# Patient Record
Sex: Female | Born: 1960 | Race: White | Hispanic: No | Smoking: Former smoker
Health system: Southern US, Community
[De-identification: ages and names within clinical notes are randomized; demographics above are authoritative.]

## PROBLEM LIST (undated history)

## (undated) DIAGNOSIS — I8390 Asymptomatic varicose veins of unspecified lower extremity: Secondary | ICD-10-CM

## (undated) DIAGNOSIS — Z8669 Personal history of other diseases of the nervous system and sense organs: Secondary | ICD-10-CM

## (undated) DIAGNOSIS — N200 Calculus of kidney: Secondary | ICD-10-CM

## (undated) DIAGNOSIS — E119 Type 2 diabetes mellitus without complications: Secondary | ICD-10-CM

## (undated) HISTORY — DX: Type 2 diabetes mellitus without complications: E11.9

## (undated) HISTORY — DX: Personal history of other diseases of the nervous system and sense organs: Z86.69

## (undated) HISTORY — DX: Asymptomatic varicose veins of unspecified lower extremity: I83.90

## (undated) HISTORY — DX: Calculus of kidney: N20.0

---
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
## 2004-03-18 LAB — HM MAMMOGRAPHY

## 2005-02-22 LAB — HM PAP SMEAR: HM PAP: NEGATIVE

## 2005-10-19 ENCOUNTER — Emergency Department: Payer: Self-pay | Admitting: Emergency Medicine

## 2005-10-19 ENCOUNTER — Other Ambulatory Visit: Payer: Self-pay

## 2005-11-30 DIAGNOSIS — G43909 Migraine, unspecified, not intractable, without status migrainosus: Secondary | ICD-10-CM | POA: Insufficient documentation

## 2005-12-08 ENCOUNTER — Ambulatory Visit: Payer: Self-pay | Admitting: Family Medicine

## 2005-12-25 ENCOUNTER — Ambulatory Visit: Payer: Self-pay | Admitting: Family Medicine

## 2006-05-17 ENCOUNTER — Ambulatory Visit: Payer: Self-pay | Admitting: Obstetrics and Gynecology

## 2006-05-31 ENCOUNTER — Ambulatory Visit: Payer: Self-pay | Admitting: Obstetrics and Gynecology

## 2006-09-02 ENCOUNTER — Ambulatory Visit: Payer: Self-pay | Admitting: Family Medicine

## 2009-06-03 ENCOUNTER — Ambulatory Visit: Payer: Self-pay

## 2012-11-14 ENCOUNTER — Ambulatory Visit: Payer: Self-pay | Admitting: Family Medicine

## 2012-11-20 DIAGNOSIS — E119 Type 2 diabetes mellitus without complications: Secondary | ICD-10-CM | POA: Insufficient documentation

## 2013-07-27 LAB — CBC AND DIFFERENTIAL
HCT: 39 % (ref 36–46)
HEMOGLOBIN: 13.1 g/dL (ref 12.0–16.0)
PLATELETS: 417 10*3/uL — AB (ref 150–399)
WBC: 12.7 10*3/mL

## 2013-08-01 ENCOUNTER — Ambulatory Visit: Payer: Self-pay | Admitting: Family Medicine

## 2013-09-25 ENCOUNTER — Ambulatory Visit: Payer: Self-pay | Admitting: Family Medicine

## 2013-10-03 ENCOUNTER — Ambulatory Visit: Payer: Self-pay | Admitting: Family Medicine

## 2013-11-20 ENCOUNTER — Ambulatory Visit: Payer: Self-pay | Admitting: Family Medicine

## 2013-11-21 ENCOUNTER — Ambulatory Visit: Payer: Self-pay | Admitting: Family Medicine

## 2014-02-22 HISTORY — PX: OTHER SURGICAL HISTORY: SHX169

## 2014-04-17 ENCOUNTER — Ambulatory Visit: Payer: Self-pay | Admitting: Family Medicine

## 2014-07-08 ENCOUNTER — Ambulatory Visit: Payer: Self-pay | Admitting: Anesthesiology

## 2014-07-24 ENCOUNTER — Ambulatory Visit: Payer: Self-pay | Admitting: Anesthesiology

## 2014-08-06 ENCOUNTER — Ambulatory Visit: Payer: Self-pay | Admitting: Family Medicine

## 2014-08-07 ENCOUNTER — Ambulatory Visit: Payer: Self-pay | Admitting: Family Medicine

## 2014-08-27 ENCOUNTER — Ambulatory Visit: Payer: Self-pay | Admitting: Anesthesiology

## 2014-10-22 ENCOUNTER — Ambulatory Visit: Payer: Self-pay | Admitting: Anesthesiology

## 2014-12-09 ENCOUNTER — Ambulatory Visit
Admit: 2014-12-09 | Disposition: A | Discharge status: Home / Self Care | Payer: Self-pay | Attending: Family Medicine | Admitting: Family Medicine

## 2014-12-14 NOTE — H&P (Signed)
PATIENT NAME:  Jean Cox, Jean Cox MR#:  [id number] DATE OF BIRTH:  03-Sep-1960  DATE OF ADMISSION:  07/08/2014  CHIEF COMPLAINT: Low back pain with posterolateral leg pain and right posterolateral shoulder pain with right arm pain.   PROCEDURE: None.   HISTORY OF PRESENT ILLNESS: Miss Tanton is a pleasant 54 year old white female with long-standing history of low back pain which is her primary pain complaint today. It has been present for 15 months and she has had an MRI that showed evidence of some degenerative disk disease. This is unavailable to me at this time, but has been requested. The pain has gradually gotten worse to the point where she is documenting a VAS of 11/10, best a 2, now a 4, and pain that is worse in the morning and after activity. Aggravating factors include bending, kneeling, lifting, squatting, and turning. Alleviating factors include stretching, hot pack placement, medication management, and physical therapy. Physical therapy has given her some mild relief, perhaps she says. She is doing stretching exercises that seem to help as well. No problems with bowel or bladder function and note she has documented weakness in the right leg. She also has associated cramping affecting the right lateral calf and lower leg with associated tingling. The pain is described as an aching,  agonizing, annoying type pain.   PAST MEDICAL HISTORY: Significant for high blood pressure, psychological history of anxiety, depression, previous suicidal ideation but not active right now, history of insomnia, GI for reflux.   SOCIAL HISTORY:  She is single. Quit smoking 10 years ago. 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   FAMILY HISTORY: Positive for cancer and high blood pressure.   CURRENT MEDICATIONS: Include amlodipine, Cymbalta, Xanax, Excedrin, tramadol, and hydrochlorothiazide.   PAST SURGICAL HISTORY: Laser vein stripping.   ALLERGIES: ERYTHROMYCIN.   PHYSICAL EXAMINATION:  GENERAL:   Reveals a pleasant 54 year old white female that is obese with a VAS of 3/10.  VITAL SIGNS:  Temperature of 97.5, blood pressure 152/87, pulse 75, rhythm is regular, respirations 16, O2 saturation 95%.  HEENT:  Pupils are equally round and reactive to light. Extraocular muscles intact.  HEART: Regular rate and rhythm without murmur.  LUNGS: Clear to auscultation with distant breath sounds.  MUSCULOSKELETAL:  With the patient standing she does have pain on extension with right lateral rotation. With the patient supine she has a positive straight leg raise at approximately 45 degrees right side. Her muscle tone and bulk is good. She has 5/5 strength throughout both proximal and distal to the lower extremities.   ASSESSMENT:  1.  Degenerative disk disease with L5 radicular symptoms.  2.  Facet arthropathy right side.  3.  Myofascial low back pain.  4.  History of depression, previous suicidal ideation, anxiety.   PLAN:   1.  I have had a long discussion with the patient regarding her care and feel that she would be a good candidate for an epidural steroid series. We have gone over the risks and benefits of the procedure with her in full detail. All questions are answered. No guarantees are made. 2.  Consider possible facet block for diagnostic purpose.  3.  Continue with physical therapy exercises for stretching, strengthening, and core as discussed with her in detail today with return to clinic at next available date.     ____________________________ Alvina Filbert. Andree Elk, MD jga:bu D: 07/09/2014 12:57:23 ET T: 07/09/2014 13:41:29 ET JOB#: [id number]  cc: Alvina Filbert. Andree Elk, MD, <Dictator> Kirstie Peri.  Caryn Section, MD Alvina Filbert Erin Obando MD ELECTRONICALLY SIGNED 07/13/2014 0:26

## 2014-12-19 ENCOUNTER — Ambulatory Visit
Admit: 2014-12-19 | Disposition: A | Discharge status: Home / Self Care | Payer: Self-pay | Attending: Anesthesiology | Admitting: Anesthesiology

## 2014-12-19 ENCOUNTER — Encounter: Payer: Self-pay | Admitting: Anesthesiology

## 2014-12-26 LAB — LIPID PANEL
Cholesterol: 196 mg/dL (ref 0–200)
HDL: 40 mg/dL (ref 35–70)
LDL Cholesterol: 95 mg/dL
TRIGLYCERIDES: 303 mg/dL — AB (ref 40–160)

## 2014-12-26 LAB — BASIC METABOLIC PANEL
BUN: 15 mg/dL (ref 4–21)
Creatinine: 0.6 mg/dL (ref <=1.1)
Glucose: 141 mg/dL
Potassium: 3.6 mmol/L (ref 3.4–5.3)
SODIUM: 135 mmol/L — AB (ref 137–147)

## 2014-12-26 LAB — HEMOGLOBIN A1C: Hgb A1c MFr Bld: 6.8 % — AB (ref 4.0–6.0)

## 2014-12-26 LAB — HEPATIC FUNCTION PANEL: ALT: 22 U/L (ref 7–35)

## 2015-01-14 NOTE — Progress Notes (Unsigned)
   Subjective:    Patient ID: Jean Cox, female    DOB: 17-Nov-1960, 54 y.o.   MRN: [id number]  HPI    Review of Systems     Objective:   Physical Exam        Assessment & Plan:  PROCEDURE PERFORMED: Lumbar facet (medial branch block)   NOTE: The patient is a 54 y.o. female who returns to [facility] for further evaluation and treatment of pain involving the lumbar and lower extremity region. ***  revealed the patient to be with evidence of ***. The risks, benefits, and expectations of the procedure have been discussed and explained to the patient who was understanding and in agreement with suggested treatment plan. We will proceed with interventional treatment as discussed and as explained to the patient who was understanding and wished to proceed with procedure as planned.   DESCRIPTION OF PROCEDURE: Lumbar facet (medial branch block) with IV Versed, IV fentanyl conscious sedation, EKG, blood pressure, pulse, and pulse oximetry monitoring. The procedure was performed with the patient in the prone position. Betadine prep of proposed entry site performed.   NEEDLE PLACEMENT AT: *** L *** lumbar facet (medial branch block). Under fluoroscopic guidance with oblique orientation of 15 degrees, a 22-gauge needle was inserted at the  vertebral body level with needle placed at the targeted area of Burton's Eye or Eye of the Scotty Dog with documentation of needle placement in the superior and lateral border of targeted area of Burton's Eye or Eye of the Scotty Dog with oblique orientation of 15 degrees. Marland Kitchen   NEEDLE PLACEMENT AT THE SACRAL ALA with AP view of the lumbosacral spine. With the patient in the prone position, Betadine prep of proposed entry site accomplished, a *** gauge needle was inserted in the region of the sacral ala (groove formed by the superior articulating process of S1 and the sacral wing). Following documentation of needle placement at the sacral ala, needle  placement was then accomplished at the S1 foramen level.   NEEDLE PLACEMENT AT THE S1 FORAMEN LEVEL under fluoroscopic guidance with AP view of the lumbosacral spine and cephalad orientation of the fluoroscope, a 22 gauge needle was placed at the superior and lateral border of the S1 foramen under fluoroscopic guidance. Following documentation of needle placement at the S1 foramen.   Needle placement was then verified at all levels on lateral view. Following documentation of needle placement at all levels on lateral view and following negative aspiration for heme and CSF, each level was injected with 25mL of 0.2% ropivacaine with 8mg  Kenalog. The patient tolerated the procedure well. A total of 40mg  of Kenalog was utilized for the procedure.   PLAN:  1. Medications: The patient will continue presently prescribed medications. 2. May consider modification of treatment regimen at time of return appointment pending response to treatment rendered on today's visit. 3. The patient is to follow-up with primary care physician for further evaluation of baseline medical problems 4. The patient may be candidate for radiofrequency procedures, and other treatment pending response to treatment and follow-up evaluation. 5. The patient has been advised to call the [facility] prior to scheduled return appointment should there be significant change in condition or should patient have other concerns regarding condition prior to scheduled return appointment.  The patient is understanding and in agreement with suggested treatment plan.

## 2015-01-23 ENCOUNTER — Other Ambulatory Visit: Payer: Self-pay | Admitting: Anesthesiology

## 2015-01-23 DIAGNOSIS — M7121 Synovial cyst of popliteal space [Baker], right knee: Secondary | ICD-10-CM

## 2015-01-27 ENCOUNTER — Other Ambulatory Visit: Payer: Self-pay | Admitting: Anesthesiology

## 2015-01-27 ENCOUNTER — Ambulatory Visit
Admission: RE | Admit: 2015-01-27 | Discharge: 2015-01-27 | Disposition: A | Discharge status: Home / Self Care | Payer: 59 | Source: Ambulatory Visit | Attending: Anesthesiology | Admitting: Anesthesiology

## 2015-01-27 DIAGNOSIS — M7121 Synovial cyst of popliteal space [Baker], right knee: Secondary | ICD-10-CM

## 2015-01-28 ENCOUNTER — Telehealth: Payer: Self-pay | Admitting: *Deleted

## 2015-01-28 DIAGNOSIS — N200 Calculus of kidney: Secondary | ICD-10-CM | POA: Insufficient documentation

## 2015-01-28 DIAGNOSIS — R079 Chest pain, unspecified: Secondary | ICD-10-CM | POA: Insufficient documentation

## 2015-01-28 DIAGNOSIS — I8390 Asymptomatic varicose veins of unspecified lower extremity: Secondary | ICD-10-CM | POA: Insufficient documentation

## 2015-01-28 DIAGNOSIS — G5790 Unspecified mononeuropathy of unspecified lower limb: Secondary | ICD-10-CM | POA: Insufficient documentation

## 2015-01-28 DIAGNOSIS — D329 Benign neoplasm of meninges, unspecified: Secondary | ICD-10-CM | POA: Insufficient documentation

## 2015-01-28 DIAGNOSIS — E785 Hyperlipidemia, unspecified: Secondary | ICD-10-CM | POA: Insufficient documentation

## 2015-01-28 NOTE — Telephone Encounter (Signed)
Patient has rash on both legs. Patient scheduled appt for 01/29/2015 at 8:00 am.

## 2015-01-29 ENCOUNTER — Encounter: Payer: Self-pay | Admitting: Family Medicine

## 2015-01-29 ENCOUNTER — Ambulatory Visit (INDEPENDENT_AMBULATORY_CARE_PROVIDER_SITE_OTHER): Payer: Managed Care, Other (non HMO) | Admitting: Family Medicine

## 2015-01-29 VITALS — BP 120/88 | HR 82 | Temp 97.9°F | Resp 16 | Wt 222.0 lb

## 2015-01-29 DIAGNOSIS — R21 Rash and other nonspecific skin eruption: Secondary | ICD-10-CM | POA: Diagnosis not present

## 2015-01-29 NOTE — Progress Notes (Signed)
   Patient: Jean Cox Female    DOB: 1961/02/26   54 y.o.   MRN: [id number] Visit Date: 01/29/2015  Today's Provider: Lelon Huh, MD   Chief Complaint  Patient presents with  . Rash   Subjective:    Rash This is a recurrent problem. The current episode started in the past 7 days. The affected locations include the left upper leg, left lower leg, left ankle, right upper leg, right lowerleg and right ankle. The rash is characterized by redness and swelling. Pertinent negatives include no anorexia, congestion, cough, diarrhea, eye pain, facial edema, fatigue, fever, joint pain, nail changes, rhinorrhea, shortness of breath, sore throat or vomiting. Past treatments include nothing. The treatment provided moderate relief.  She had identical rash several years ago which was related to working in [employer], and resolved after changing work duties to a cooler environment. She is now working at [employer] and having to work in very hot environment.    Previous Medications   ALPRAZOLAM (XANAX) 0.5 MG TABLET    Take by mouth.   AMLODIPINE (NORVASC) 10 MG TABLET    Take by mouth.   CYCLOBENZAPRINE (FLEXERIL) 10 MG TABLET    Take by mouth.   DIAZEPAM (VALIUM) 5 MG TABLET    Take by mouth.   DULOXETINE (CYMBALTA) 30 MG CAPSULE    Take by mouth.   HYDROCODONE-ACETAMINOPHEN PO    Take by mouth.   METFORMIN (GLUMETZA) 500 MG (MOD) 24 HR TABLET    Take by mouth.   OXYCODONE-ACETAMINOPHEN (PERCOCET/ROXICET) 5-325 MG PER TABLET    Take by mouth.   PROPRANOLOL-HYDROCHLOROTHIAZIDE (INDERIDE) 40-25 MG PER TABLET    Take by mouth.   TRAMADOL (ULTRAM) 50 MG TABLET    Take by mouth.    Review of Systems  Constitutional: Negative for fever and fatigue.  HENT: Negative for congestion, rhinorrhea and sore throat.   Eyes: Negative for pain.  Respiratory: Negative for cough and shortness of breath.   Gastrointestinal: Positive for nausea. Negative for vomiting, diarrhea and anorexia.  Musculoskeletal:  Negative for joint pain.  Skin: Positive for rash. Negative for nail changes.  Neurological: Positive for light-headedness.    History  Substance Use Topics  . Smoking status: Former Research scientist (life sciences)  . Smokeless tobacco: Not on file     Comment: Quit in 2005, smoked for 20-25 years, previously smoked 1 PPD  . Alcohol Use: Not on file   Objective:   BP 120/88 mmHg  Pulse 82  Temp(Src) 97.9 F (36.6 C) (Oral)  Resp 16  Wt 222 lb (100.699 kg)  SpO2 96%  LMP 01/28/2006  Physical Exam  General appearance: alert, well developed, well nourished, cooperative and in no distress Head: Normocephalic, without obvious abnormality, atraumatic Lungs: Respirations even and unlabored Extremities: No gross deformities Skin: Diffuse erythematous rash both lower extremities consistent with urticaria  Psych: Appropriate mood and affect. Neurologic: Mental status: Alert, oriented to person, place, and time, thought content appropriate.    Assessment & Plan:     1. Rash and nonspecific skin eruption Apparently triggered by working in hot conditions. Was previously treated with triamcinolone, which she still has and states is working fairly well. Note written for work to stay in environment less then 80 degrees.    Follow up: No Follow-up on file.

## 2015-02-04 IMAGING — CR DG KNEE COMPLETE 4+V*R*
1 series · 4 of 4 positions shown · non-contrast
Comparison: None.

CLINICAL DATA: Pain

EXAM:
RIGHT KNEE - COMPLETE 4+ VIEW

[Series 1: kdxr knee rt comp with obliques · 0.14mm/px · 4 of 4 slices shown]
[im 1/4]
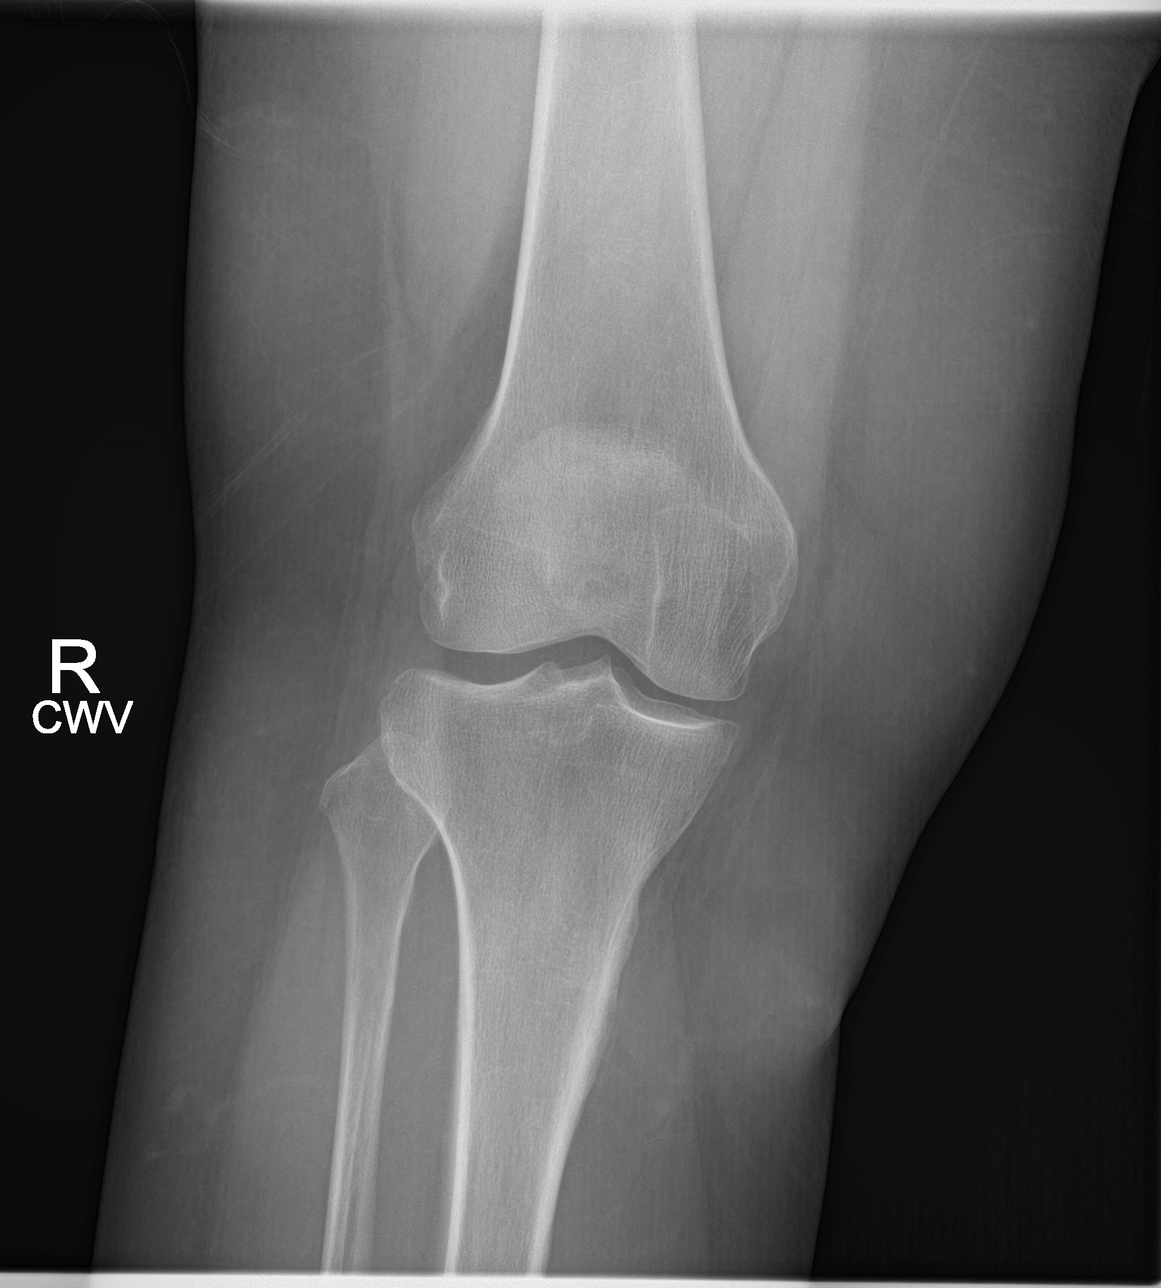
[im 2/4]
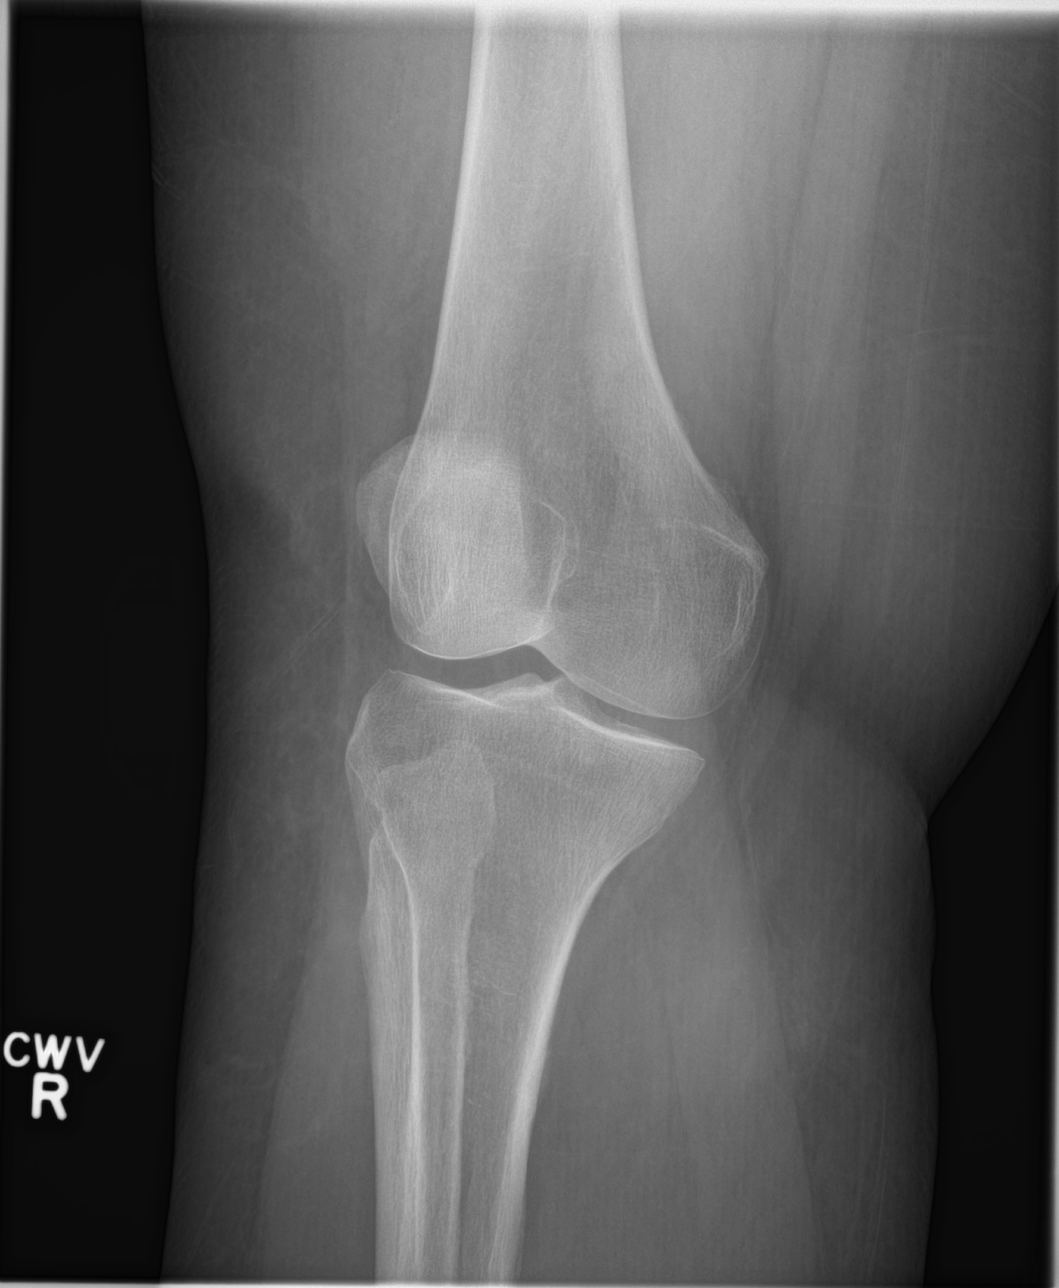
[im 3/4]
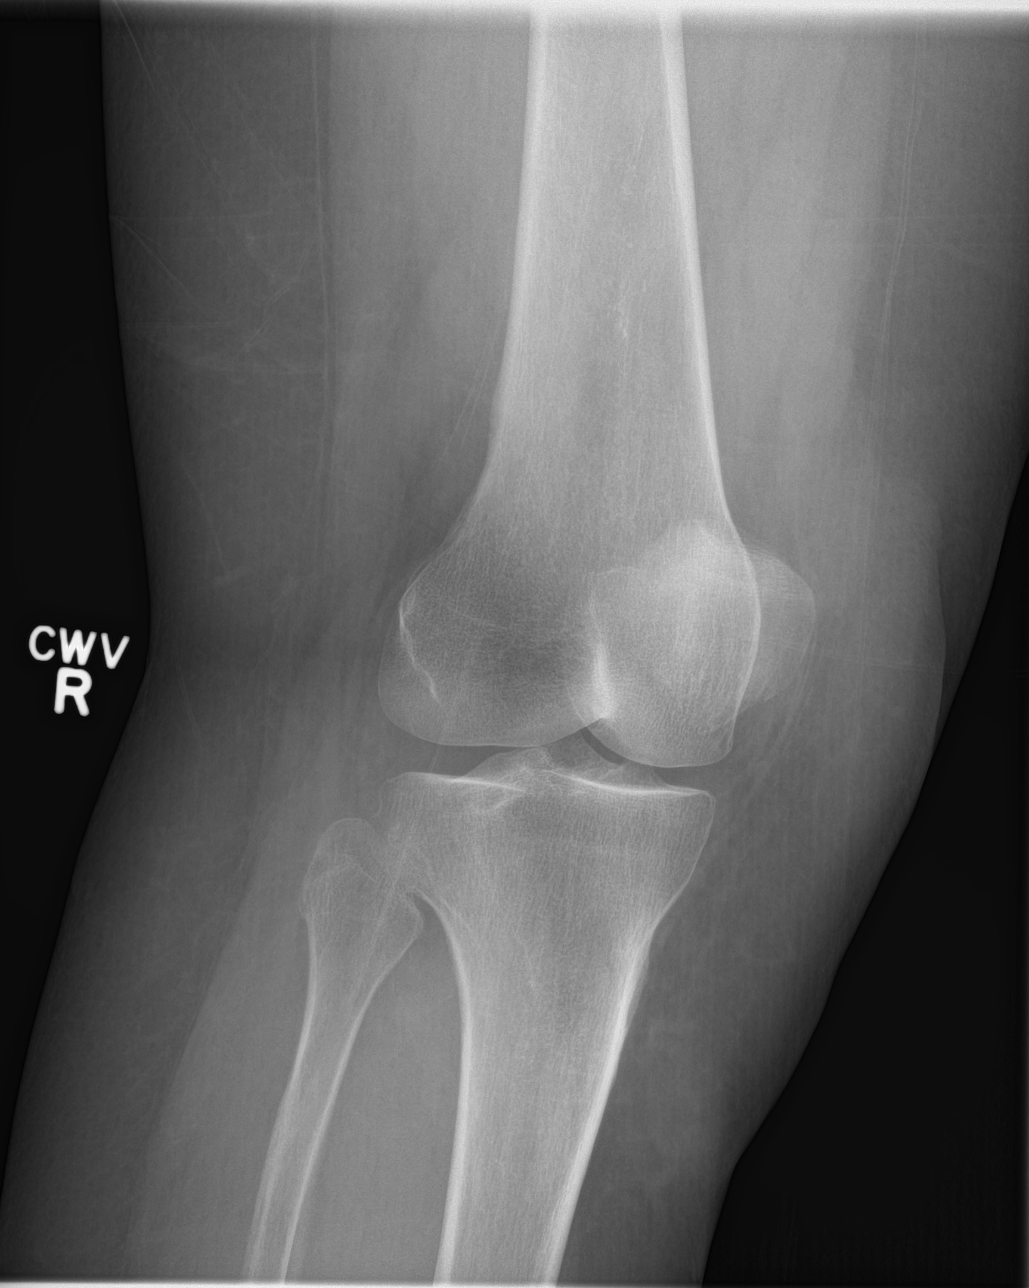
[im 4/4]
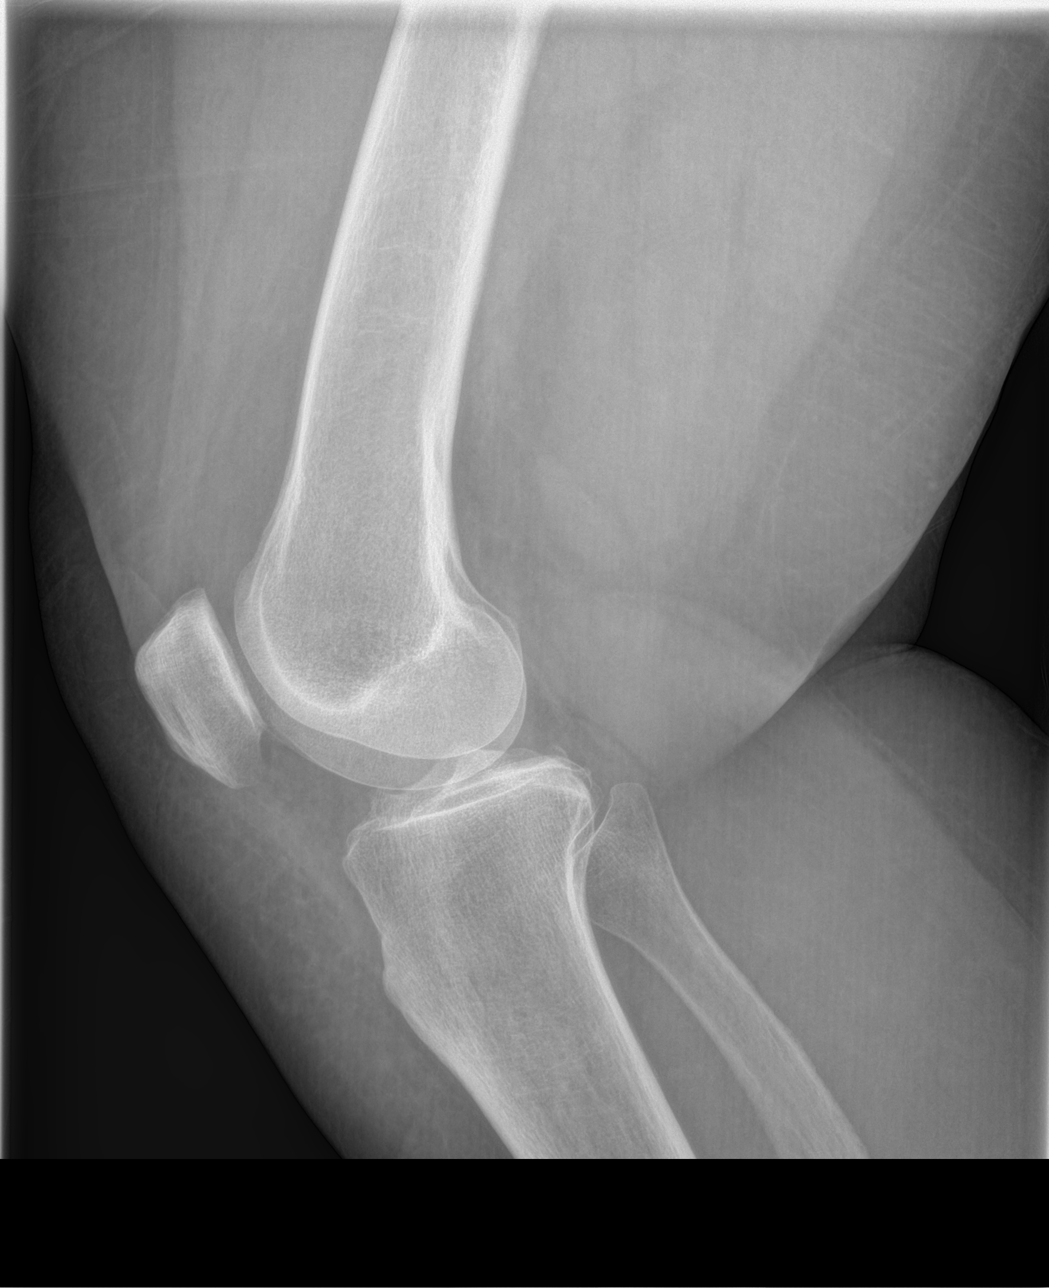

[4 of 4 positions shown; findings below may reference images not displayed]

FINDINGS: Frontal, lateral, and bilateral oblique views were obtained. There
is no fracture, dislocation, or effusion. Joint spaces appear
intact. No erosive change.
IMPRESSION: No fracture or effusion.  No appreciable arthropathic change.

## 2015-02-28 ENCOUNTER — Encounter: Payer: Self-pay | Admitting: Family Medicine

## 2015-02-28 ENCOUNTER — Ambulatory Visit (INDEPENDENT_AMBULATORY_CARE_PROVIDER_SITE_OTHER): Payer: Managed Care, Other (non HMO) | Admitting: Family Medicine

## 2015-02-28 VITALS — BP 140/100 | HR 84 | Temp 98.0°F | Resp 16 | Ht 63.0 in | Wt 231.0 lb

## 2015-02-28 DIAGNOSIS — M545 Low back pain, unspecified: Secondary | ICD-10-CM

## 2015-02-28 MED ORDER — PREDNISONE 10 MG PO TABS: ORAL_TABLET | ORAL | Status: AC

## 2015-02-28 NOTE — Progress Notes (Signed)
       Patient: Jean Cox Female    DOB: [birth year]   54 y.o.   MRN: [id number] Visit Date: 02/28/2015  Today's Provider: Lelon Huh, MD   Chief Complaint  Patient presents with  . Fall    At work   Subjective:     Patient fell on 02/08/2015 at work. Patient said her right knee gave out while she was walking and the heel of her shoe got caught in a groove on the pavement.  She fell on her right side and started having pain on the left side of her back the next day. She continues to be followed by pain clinic.     Fall The accident occurred more than 1 week ago (02/08/2015). The fall occurred while walking. She landed on concrete. The point of impact was the right hip, right shoulder and right elbow. The pain is at a severity of 4/10. The pain is mild. The symptoms are aggravated by movement, heat and flexion. Associated symptoms include headaches. Pertinent negatives include no abdominal pain, fever, nausea, numbness or tingling. She has tried acetaminophen, heat, ice, immobilization and rest for the symptoms. The treatment provided mild relief.      Previous Medications   ALPRAZOLAM (XANAX) 0.5 MG TABLET    Take by mouth.   AMLODIPINE (NORVASC) 10 MG TABLET    Take by mouth.   CYCLOBENZAPRINE (FLEXERIL) 10 MG TABLET    Take by mouth.   DIAZEPAM (VALIUM) 5 MG TABLET    Take by mouth.   DULOXETINE (CYMBALTA) 30 MG CAPSULE    Take by mouth.   HYDROCODONE-ACETAMINOPHEN (NORCO) 10-325 MG PER TABLET    Take 1 tablet by mouth every 6 (six) hours as needed.   HYDROCODONE-ACETAMINOPHEN PO    Take by mouth.   METFORMIN (GLUMETZA) 500 MG (MOD) 24 HR TABLET    Take by mouth.   OXYCODONE-ACETAMINOPHEN (PERCOCET/ROXICET) 5-325 MG PER TABLET    Take by mouth.   PROPRANOLOL-HYDROCHLOROTHIAZIDE (INDERIDE) 40-25 MG PER TABLET    Take by mouth.   TRAMADOL (ULTRAM) 50 MG TABLET    Take by mouth.    Review of Systems  Constitutional: Negative for fever.  Cardiovascular: Negative for chest  pain and palpitations.  Gastrointestinal: Negative for nausea and abdominal pain.  Musculoskeletal: Positive for back pain.  Neurological: Positive for headaches. Negative for tingling and numbness.    History  Substance Use Topics  . Smoking status: Former Smoker -- 1.00 packs/day for 25 years    Types: Cigarettes    Quit date: 08/24/2003  . Smokeless tobacco: Not on file  . Alcohol Use: Not on file   Objective:   BP 140/100 mmHg  Pulse 84  Temp(Src) 98 F (36.7 C) (Oral)  Resp 16  Ht 5\' 3"  (1.6 m)  Wt 231 lb (104.781 kg)  BMI 40.93 kg/m2  SpO2 95%  LMP 01/28/2006  Physical Exam      Assessment & Plan:     1. Midline low back pain without sciatica Strain in back due to fall. Currently stable and expect resolution with conservative therapy. Call if not continuing to improve.     Lelon Huh, MD  [facility]

## 2015-03-13 ENCOUNTER — Other Ambulatory Visit: Payer: Self-pay | Admitting: Family Medicine

## 2015-03-13 MED ORDER — PREDNISONE 10 MG (21) PO TBPK: ORAL_TABLET | ORAL | Status: DC

## 2015-03-13 NOTE — Telephone Encounter (Signed)
Pt contacted office for refill request on the following medications:  PREDNISONE 10 MGPREDNISONE 10 MG.  [facility].  Pt states she is still pain on left side due to fall.  CB#[phone]

## 2015-04-18 ENCOUNTER — Encounter: Payer: Self-pay | Admitting: Family Medicine

## 2015-04-18 ENCOUNTER — Ambulatory Visit (INDEPENDENT_AMBULATORY_CARE_PROVIDER_SITE_OTHER): Payer: Managed Care, Other (non HMO) | Admitting: Family Medicine

## 2015-04-18 VITALS — BP 128/88 | HR 75 | Temp 98.2°F | Resp 16 | Ht 63.0 in | Wt 226.0 lb

## 2015-04-18 DIAGNOSIS — K219 Gastro-esophageal reflux disease without esophagitis: Secondary | ICD-10-CM

## 2015-04-18 DIAGNOSIS — R1310 Dysphagia, unspecified: Secondary | ICD-10-CM | POA: Diagnosis not present

## 2015-04-18 DIAGNOSIS — E041 Nontoxic single thyroid nodule: Secondary | ICD-10-CM | POA: Diagnosis not present

## 2015-04-18 DIAGNOSIS — F419 Anxiety disorder, unspecified: Secondary | ICD-10-CM

## 2015-04-18 MED ORDER — ALPRAZOLAM 0.5 MG PO TABS: 0.5000 mg | ORAL_TABLET | Freq: Every day | ORAL | Status: DC

## 2015-04-18 MED ORDER — OMEPRAZOLE 40 MG PO CPDR: 40.0000 mg | DELAYED_RELEASE_CAPSULE | Freq: Every day | ORAL | Status: DC

## 2015-04-18 NOTE — Progress Notes (Signed)
Patient: Jean Cox Female    DOB: [birth year]   54 y.o.   MRN: [id number] Visit Date: 04/18/2015  Today's Provider: Lelon Huh, MD   Chief Complaint  Patient presents with  . Sore Throat   Subjective:    HPI  Patient stated that her throat is more irritated then sore. Patient has had her throat scraped in 2005 due to nodules in the back of her throat with similar sensation, but not as severe as it is now. Has now been bothering for the last year and getting slowly worse. States food often gets stuck in back of throat. She does have history of GERD and states that Nexium helped in the past. Now has heartburn if she eats anything acid or tomato based foods, typically having symptoms a few days each week.    Allergies  Allergen Reactions  . Amitriptyline Hcl     Sedation  . Lexapro  [Escitalopram]     skittish and suidical  . Lyrica  [Pregabalin]     Nightmares  . Topamax  [Topiramate]     Other reaction(s): Headache  . Erythromycin Rash   Previous Medications   ALPRAZOLAM (XANAX) 0.5 MG TABLET    Take 0.5 mg by mouth daily.    AMLODIPINE (NORVASC) 10 MG TABLET    Take 10 mg by mouth daily.    ASPIRIN-ACETAMINOPHEN-CAFFEINE (EXCEDRIN PO)    Take by mouth as needed.   CYCLOBENZAPRINE (FLEXERIL) 10 MG TABLET    Take by mouth.   DIAZEPAM (VALIUM) 5 MG TABLET    Take 5 mg by mouth daily as needed.    DULOXETINE (CYMBALTA) 30 MG CAPSULE    Take 30 mg by mouth daily.    HYDROCODONE-ACETAMINOPHEN (NORCO) 10-325 MG PER TABLET    Take 1 tablet by mouth every 6 (six) hours as needed.   HYDROCODONE-ACETAMINOPHEN PO    Take by mouth.   METFORMIN (GLUMETZA) 500 MG (MOD) 24 HR TABLET    Take 500 mg by mouth daily.    OXYCODONE-ACETAMINOPHEN (PERCOCET/ROXICET) 5-325 MG PER TABLET    Take by mouth.   PREDNISONE (STERAPRED UNI-PAK 21 TAB) 10 MG (21) TBPK TABLET    6 tablets for one day, then 5 tablets for one day, then 4 tablets, then 3 tablets, then 2 tablets, then 1 tablet for  one day   PROPRANOLOL-HYDROCHLOROTHIAZIDE (INDERIDE) 40-25 MG PER TABLET    Take 1 tablet by mouth daily.    TAPENTADOL HCL (NUCYNTA) 75 MG TABS    Take 75 mg by mouth 4 (four) times daily as needed.   TRAMADOL (ULTRAM) 50 MG TABLET    Take by mouth.    Review of Systems  Constitutional: Negative.   HENT: Positive for sore throat.   Cardiovascular: Negative.   Neurological: Positive for dizziness. Negative for light-headedness and headaches.    Social History  Substance Use Topics  . Smoking status: Former Smoker -- 1.00 packs/day for 25 years    Types: Cigarettes    Quit date: 08/24/2003  . Smokeless tobacco: Not on file  . Alcohol Use: Not on file   Objective:   BP 128/88 mmHg  Pulse 75  Temp(Src) 98.2 F (36.8 C) (Oral)  Resp 16  Ht 5\' 3"  (1.6 m)  Wt 226 lb (102.513 kg)  BMI 40.04 kg/m2  SpO2 98%  Physical Exam      Assessment & Plan:           Lelon Huh,  MD  [facility]

## 2015-04-22 ENCOUNTER — Other Ambulatory Visit: Payer: Self-pay | Admitting: Pain Medicine

## 2015-04-22 DIAGNOSIS — M25561 Pain in right knee: Secondary | ICD-10-CM

## 2015-04-27 ENCOUNTER — Ambulatory Visit
Admission: RE | Admit: 2015-04-27 | Discharge: 2015-04-27 | Disposition: A | Discharge status: Home / Self Care | Payer: 59 | Source: Ambulatory Visit | Attending: Pain Medicine | Admitting: Pain Medicine

## 2015-04-27 DIAGNOSIS — M25561 Pain in right knee: Secondary | ICD-10-CM

## 2015-04-29 ENCOUNTER — Other Ambulatory Visit: Payer: Self-pay | Admitting: Family Medicine

## 2015-04-29 MED ORDER — ALPRAZOLAM 0.5 MG PO TABS: 0.5000 mg | ORAL_TABLET | Freq: Every day | ORAL | Status: DC

## 2015-04-29 NOTE — Telephone Encounter (Signed)
Pt contacted office for refill request on the following medications: ALPRAZolam (XANAX) 0.5 MG tablet to be faxed or called into [facility]. (Help Line# [phone]) Pt stated that she was given a hard copy of the RX but because she now has to use the new mail order pharmacy b/c of [employer] she contacted them to see how she needs to get the RX filled. Pt stated they were going to send [place] a request and just wanted to let us know that she was going to have to start using that pharmacy and that she will bring her hard copy back to the office so we would know she wasn't trying to double up on the RX. Thanks TNP

## 2015-04-29 NOTE — Telephone Encounter (Signed)
Please call in alprazolam.  

## 2015-04-30 MED ORDER — ALPRAZOLAM 0.5 MG PO TABS: 0.5000 mg | ORAL_TABLET | Freq: Every day | ORAL | Status: DC

## 2015-04-30 NOTE — Telephone Encounter (Signed)
Faxed rx to [facility] order, per pt.

## 2015-05-06 ENCOUNTER — Other Ambulatory Visit: Payer: Self-pay | Admitting: Family Medicine

## 2015-06-17 ENCOUNTER — Encounter: Payer: Self-pay | Admitting: Family Medicine

## 2015-06-17 ENCOUNTER — Ambulatory Visit (INDEPENDENT_AMBULATORY_CARE_PROVIDER_SITE_OTHER): Payer: Managed Care, Other (non HMO) | Admitting: Family Medicine

## 2015-06-17 VITALS — BP 140/98 | HR 82 | Temp 98.0°F | Resp 16 | Ht 63.0 in | Wt 227.0 lb

## 2015-06-17 DIAGNOSIS — J069 Acute upper respiratory infection, unspecified: Secondary | ICD-10-CM | POA: Diagnosis not present

## 2015-06-17 DIAGNOSIS — Z23 Encounter for immunization: Secondary | ICD-10-CM | POA: Diagnosis not present

## 2015-06-17 NOTE — Progress Notes (Signed)
Patient: Jean Cox Female    DOB: 24-Jan-1961   54 y.o.   MRN: [id number] Visit Date: 06/17/2015  Today's Provider: Lelon Huh, MD   Chief Complaint  Patient presents with  . Joint Pain   Subjective:    HPI  Joint pain: Patient comes in today stating she has had pain in her right knee and both hands for a couple of years. Patient reports the pain has worsened. Patient states she has a cyst on her right knee that is causing the knee pain and arthritis in her hands. She states she is scheduled for ultrasound guided steroid injection tomorrow.   Also complains of sinus congestion and drainage for the last 4-5 days. Had a fever of 101 3 days ago but none since. Has mild non-productive cough, but no shortness of breath.     Allergies  Allergen Reactions  . Amitriptyline Hcl     Sedation  . Lexapro  [Escitalopram]     skittish and suidical  . Lyrica  [Pregabalin]     Nightmares  . Topamax  [Topiramate]     Other reaction(s): Headache  . Erythromycin Rash   Previous Medications   ALPRAZOLAM (XANAX) 0.5 MG TABLET    Take 1 tablet (0.5 mg total) by mouth daily.   AMLODIPINE (NORVASC) 10 MG TABLET    Take 10 mg by mouth daily.    AMRIX 15 MG 24 HR CAPSULE    Take 1 capsule by mouth 2 (two) times daily.   ASPIRIN-ACETAMINOPHEN-CAFFEINE (EXCEDRIN PO)    Take by mouth as needed.   DIAZEPAM (VALIUM) 5 MG TABLET    Take 5 mg by mouth daily as needed.    DULOXETINE (CYMBALTA) 30 MG CAPSULE    Take 30 mg by mouth daily.    METFORMIN (GLUMETZA) 500 MG (MOD) 24 HR TABLET    Take 500 mg by mouth daily.    OMEPRAZOLE (PRILOSEC) 40 MG CAPSULE    Take 1 capsule (40 mg total) by mouth daily.   PROPRANOLOL-HYDROCHLOROTHIAZIDE (INDERIDE) 40-25 MG TABLET    Take 1 tablet by mouth daily.   TAPENTADOL HCL (NUCYNTA) 75 MG TABS    Take 75 mg by mouth 4 (four) times daily as needed.   TIZANIDINE (ZANAFLEX) 2 MG TABLET    Take 1 tablet by mouth 2 (two) times daily as needed.    Review of  Systems  Constitutional: Negative for fever, chills, appetite change and fatigue.  HENT: Positive for voice change.   Respiratory: Positive for cough. Negative for chest tightness and shortness of breath.   Cardiovascular: Negative for chest pain and palpitations.  Gastrointestinal: Negative for nausea, vomiting and abdominal pain.  Musculoskeletal: Positive for arthralgias (in right knee and hands). Negative for myalgias and joint swelling.  Neurological: Positive for headaches. Negative for dizziness, tremors, seizures, syncope, facial asymmetry, weakness, light-headedness and numbness.    Social History  Substance Use Topics  . Smoking status: Former Smoker -- 1.00 packs/day for 25 years    Types: Cigarettes    Quit date: 08/24/2003  . Smokeless tobacco: Not on file  . Alcohol Use: Not on file   Objective:   BP 140/98 mmHg  Pulse 82  Temp(Src) 98 F (36.7 C) (Oral)  Resp 16  Ht 5\' 3"  (1.6 m)  Wt 227 lb (102.967 kg)  BMI 40.22 kg/m2  Physical Exam  General Appearance:    Alert, cooperative, no distress  HENT:   bilateral TM normal  without fluid or infection, neck without nodes, throat normal without erythema or exudate, sinuses nontender and nasal mucosa pale and congested  Eyes:    PERRL, conjunctiva/corneas clear, EOM's intact       Lungs:     Clear to auscultation bilaterally, respirations unlabored  Heart:    Regular rate and rhythm  Neurologic:   Awake, alert, oriented x 3. No apparent focal neurological           defect.          Assessment & Plan:     1. Upper respiratory infection Counseled regarding signs and symptoms of viral and bacterial respiratory infections. Advised to call or return for additional evaluation if he develops any sign of bacterial infection, or if current symptoms last longer than 10 days.    2. Need for influenza vaccination  - Flu Vaccine QUAD 36+ mos IM       Lelon Huh, MD  [facility]

## 2015-06-23 ENCOUNTER — Other Ambulatory Visit: Payer: Self-pay | Admitting: Internal Medicine

## 2015-06-23 DIAGNOSIS — E0590 Thyrotoxicosis, unspecified without thyrotoxic crisis or storm: Secondary | ICD-10-CM

## 2015-06-23 DIAGNOSIS — E0520 Thyrotoxicosis with toxic multinodular goiter without thyrotoxic crisis or storm: Secondary | ICD-10-CM

## 2015-06-30 ENCOUNTER — Ambulatory Visit: Payer: 59

## 2015-07-01 ENCOUNTER — Other Ambulatory Visit: Payer: 59

## 2015-07-02 ENCOUNTER — Other Ambulatory Visit: Payer: 59

## 2015-07-02 ENCOUNTER — Ambulatory Visit: Payer: 59

## 2015-07-03 ENCOUNTER — Other Ambulatory Visit: Payer: 59

## 2015-07-15 ENCOUNTER — Encounter
Admission: RE | Admit: 2015-07-15 | Discharge: 2015-07-15 | Disposition: A | Discharge status: Home / Self Care | Payer: 59 | Source: Ambulatory Visit | Attending: Internal Medicine | Admitting: Internal Medicine

## 2015-07-15 DIAGNOSIS — E0590 Thyrotoxicosis, unspecified without thyrotoxic crisis or storm: Secondary | ICD-10-CM

## 2015-07-15 DIAGNOSIS — E0520 Thyrotoxicosis with toxic multinodular goiter without thyrotoxic crisis or storm: Secondary | ICD-10-CM

## 2015-07-15 MED ORDER — SODIUM IODIDE I-123 3.7 MBQ PO CAPS
157.0000 | ORAL_CAPSULE | Freq: Once | ORAL | Status: AC
  Administered 2015-07-15: 157 via ORAL

## 2015-07-16 ENCOUNTER — Encounter
Admission: RE | Admit: 2015-07-16 | Discharge: 2015-07-16 | Disposition: A | Discharge status: Home / Self Care | Payer: 59 | Source: Ambulatory Visit | Attending: Internal Medicine | Admitting: Internal Medicine

## 2015-07-16 DIAGNOSIS — E0520 Thyrotoxicosis with toxic multinodular goiter without thyrotoxic crisis or storm: Secondary | ICD-10-CM | POA: Diagnosis present

## 2015-07-16 DIAGNOSIS — E0590 Thyrotoxicosis, unspecified without thyrotoxic crisis or storm: Secondary | ICD-10-CM | POA: Diagnosis present

## 2015-07-25 ENCOUNTER — Other Ambulatory Visit: Payer: Self-pay | Admitting: Internal Medicine

## 2015-07-25 DIAGNOSIS — E0590 Thyrotoxicosis, unspecified without thyrotoxic crisis or storm: Secondary | ICD-10-CM

## 2015-08-08 ENCOUNTER — Encounter
Admission: RE | Admit: 2015-08-08 | Discharge: 2015-08-08 | Disposition: A | Discharge status: Home / Self Care | Payer: 59 | Source: Ambulatory Visit | Attending: Internal Medicine | Admitting: Internal Medicine

## 2015-08-08 DIAGNOSIS — E0590 Thyrotoxicosis, unspecified without thyrotoxic crisis or storm: Secondary | ICD-10-CM | POA: Insufficient documentation

## 2015-08-08 MED ORDER — SODIUM IODIDE I 131 CAPSULE
28.6700 | Freq: Once | INTRAVENOUS | Status: AC | PRN
  Administered 2015-08-08: 28.65 via ORAL

## 2015-09-16 ENCOUNTER — Other Ambulatory Visit: Payer: Self-pay | Admitting: Pain Medicine

## 2015-09-16 DIAGNOSIS — M545 Low back pain: Secondary | ICD-10-CM

## 2015-09-22 ENCOUNTER — Ambulatory Visit
Admission: RE | Admit: 2015-09-22 | Discharge: 2015-09-22 | Disposition: A | Discharge status: Home / Self Care | Payer: 59 | Source: Ambulatory Visit | Attending: Pain Medicine | Admitting: Pain Medicine

## 2015-09-22 DIAGNOSIS — M545 Low back pain: Secondary | ICD-10-CM

## 2015-10-01 DIAGNOSIS — E0520 Thyrotoxicosis with toxic multinodular goiter without thyrotoxic crisis or storm: Secondary | ICD-10-CM | POA: Insufficient documentation

## 2015-10-14 ENCOUNTER — Other Ambulatory Visit: Payer: Self-pay | Admitting: Family Medicine

## 2015-12-17 ENCOUNTER — Telehealth: Payer: Self-pay | Admitting: Family Medicine

## 2015-12-17 MED ORDER — MUPIROCIN 2 % EX OINT: 1.0000 "application " | TOPICAL_OINTMENT | Freq: Two times a day (BID) | CUTANEOUS | Status: DC

## 2015-12-17 NOTE — Telephone Encounter (Signed)
Pt contacted office for refill request on the following medications: Mupirocin 2% Ointment to [facility] on [address]. 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 Last written: 08/04/12. Please advise. Thanks TNP

## 2016-01-05 ENCOUNTER — Other Ambulatory Visit: Payer: Self-pay | Admitting: *Deleted

## 2016-01-05 MED ORDER — ALPRAZOLAM 0.5 MG PO TABS: 0.5000 mg | ORAL_TABLET | Freq: Every day | ORAL | Status: DC

## 2016-01-05 NOTE — Telephone Encounter (Signed)
Please call in alprazolam.  

## 2016-01-06 MED ORDER — ALPRAZOLAM 0.5 MG PO TABS: 0.5000 mg | ORAL_TABLET | Freq: Every day | ORAL | Status: DC

## 2016-01-06 NOTE — Telephone Encounter (Signed)
Rx faxed to pharmacy  

## 2016-03-23 ENCOUNTER — Encounter: Payer: Self-pay | Admitting: Family Medicine

## 2016-03-23 ENCOUNTER — Ambulatory Visit (INDEPENDENT_AMBULATORY_CARE_PROVIDER_SITE_OTHER): Payer: Managed Care, Other (non HMO) | Admitting: Family Medicine

## 2016-03-23 VITALS — BP 140/90 | HR 76 | Temp 97.8°F | Resp 18 | Wt 233.0 lb

## 2016-03-23 DIAGNOSIS — R42 Dizziness and giddiness: Secondary | ICD-10-CM

## 2016-03-23 NOTE — Progress Notes (Signed)
Patient: Jean Cox Female    DOB: 1961/03/27   55 y.o.   MRN: [id number] Visit Date: 03/23/2016  Today's Provider: Lelon Huh, MD   Chief Complaint  Patient presents with  . Dizziness   Subjective:    Dizziness  This is a new problem. Episode onset: 3 nights ago. The problem occurs intermittently. Progression since onset: recurrent. Associated symptoms include arthralgias, chills, diaphoresis, fatigue, a fever (subjective), myalgias, nausea and weakness. Pertinent negatives include no abdominal pain, anorexia, change in bowel habit, chest pain, congestion, coughing, headaches, joint swelling, neck pain, numbness, rash, sore throat, swollen glands, urinary symptoms, vertigo, visual change or vomiting. Nothing aggravates the symptoms. She has tried nothing for the symptoms.  Patient states the dizziness occurs in episodes that last about 15-30 minutes, then resolve. Feels like she is lightly headed, no spinning sensation. Felt a little malaise today breaking out in sweats. Some sweats. Feels depressed the last couple of days. Was out of thyroid medications last week. No dyspnea chest pains or heart flutters.     Allergies  Allergen Reactions  . Amitriptyline Hcl     Sedation  . Lexapro  [Escitalopram]     skittish and suidical  . Lyrica  [Pregabalin]     Nightmares  . Topamax  [Topiramate]     Other reaction(s): Headache  . Erythromycin Rash   Current Meds  Medication Sig  . amLODipine (NORVASC) 10 MG tablet TAKE 1 TABLET BY MOUTH DAILY  . Aspirin-Acetaminophen-Caffeine (EXCEDRIN PO) Take by mouth as needed.  . DULoxetine (CYMBALTA) 30 MG capsule TAKE 1 CAPSULE BY MOUTH DAILY  . Levothyroxine Sodium 125 MCG CAPS Take 1 tablet by mouth daily.  . mupirocin ointment (BACTROBAN) 2 % Place 1 application into the nose 2 (two) times daily.  Marland Kitchen omeprazole (PRILOSEC) 40 MG capsule Take 1 capsule (40 mg total) by mouth daily.  . propranolol-hydrochlorothiazide (INDERIDE)  40-25 MG tablet Take 1 tablet by mouth daily.  . Tapentadol HCl (NUCYNTA) 75 MG TABS Take 75 mg by mouth 4 (four) times daily as needed.  Marland Kitchen tiZANidine (ZANAFLEX) 2 MG tablet Take 1 tablet by mouth 2 (two) times daily as needed.  . [DISCONTINUED] AMRIX 15 MG 24 hr capsule Take 1 capsule by mouth 2 (two) times daily.  . [DISCONTINUED] diazepam (VALIUM) 5 MG tablet Take 5 mg by mouth daily as needed.     Review of Systems  Constitutional: Positive for chills, diaphoresis, fatigue and fever (subjective). Negative for appetite change.  HENT: Negative for congestion and sore throat.   Respiratory: Negative for cough, chest tightness and shortness of breath.   Cardiovascular: Negative for chest pain and palpitations.  Gastrointestinal: Positive for nausea. Negative for abdominal pain, anorexia, change in bowel habit and vomiting.  Musculoskeletal: Positive for arthralgias, back pain and myalgias. Negative for joint swelling and neck pain.  Skin: Negative for rash.  Neurological: Positive for dizziness and weakness. Negative for vertigo, numbness and headaches.    Social History  Substance Use Topics  . Smoking status: Former Smoker    Packs/day: 1.00    Years: 25.00    Types: Cigarettes    Quit date: 08/24/2003  . Smokeless tobacco: Never Used  . Alcohol use No   Objective:   BP 140/90   Pulse 76   Temp 97.8 F (36.6 C) (Oral)   Resp 18   Wt 233 lb (105.7 kg)   LMP 01/28/2006   SpO2 98% Comment: room air  BMI 41.27 kg/m   Physical Exam  General Appearance:    Alert, cooperative, no distress, obese  HEENT:   Mild congestion nasal turbinates with clear discharge. No sinus tenderness.   Eyes:    PERRL, conjunctiva/corneas clear, EOM's intact       Lungs:     Clear to auscultation bilaterally, respirations unlabored  Heart:    Regular rate and rhythm, I/VI systolic murmur.   Neurologic:   Awake, alert, oriented x 3. No apparent focal neurological           Defect. Normal finger to  nose, negative rhomberg. No tremors.      EKG: NSR, no ischemic changes.     Assessment & Plan:     1. Dizziness Unremarkable exam, no clear etiology. Check labs. Push fluids.   - EKG 12-Lead - Comprehensive metabolic panel - CBC - Hemoglobin A1c - Troponin I       Lelon Huh, MD  [facility]

## 2016-03-24 LAB — COMPREHENSIVE METABOLIC PANEL
ALBUMIN: 4.6 g/dL (ref 3.5–5.5)
ALT: 17 IU/L (ref 0–32)
AST: 15 IU/L (ref 0–40)
Albumin/Globulin Ratio: 1.5 (ref 1.2–2.2)
Alkaline Phosphatase: 114 IU/L (ref 39–117)
BILIRUBIN TOTAL: 0.4 mg/dL (ref 0.0–1.2)
BUN / CREAT RATIO: 12 (ref 9–23)
BUN: 9 mg/dL (ref 6–24)
CHLORIDE: 99 mmol/L (ref 96–106)
CO2: 25 mmol/L (ref 18–29)
CREATININE: 0.75 mg/dL (ref 0.57–1.00)
Calcium: 9.8 mg/dL (ref 8.7–10.2)
GFR calc non Af Amer: 92 mL/min/{1.73_m2} (ref >59)
GFR, EST AFRICAN AMERICAN: 106 mL/min/{1.73_m2} (ref >59)
GLUCOSE: 137 mg/dL — AB (ref 65–99)
Globulin, Total: 3.1 g/dL (ref 1.5–4.5)
Potassium: 3.6 mmol/L (ref 3.5–5.2)
Sodium: 141 mmol/L (ref 134–144)
TOTAL PROTEIN: 7.7 g/dL (ref 6.0–8.5)

## 2016-03-24 LAB — CBC
HEMATOCRIT: 41.8 % (ref 34.0–46.6)
Hemoglobin: 13.8 g/dL (ref 11.1–15.9)
MCH: 26.8 pg (ref 26.6–33.0)
MCHC: 33 g/dL (ref 31.5–35.7)
MCV: 81 fL (ref 79–97)
Platelets: 461 10*3/uL — ABNORMAL HIGH (ref 150–379)
RBC: 5.14 x10E6/uL (ref 3.77–5.28)
RDW: 13.4 % (ref 12.3–15.4)
WBC: 15.8 10*3/uL — ABNORMAL HIGH (ref 3.4–10.8)

## 2016-03-24 LAB — TROPONIN I: Troponin I: <0.01 ng/mL (ref 0.00–0.04)

## 2016-03-24 LAB — HEMOGLOBIN A1C
Est. average glucose Bld gHb Est-mCnc: 169 mg/dL
Hgb A1c MFr Bld: 7.5 % — ABNORMAL HIGH (ref 4.8–5.6)

## 2016-06-07 ENCOUNTER — Encounter: Payer: Self-pay | Admitting: Family Medicine

## 2016-06-07 ENCOUNTER — Ambulatory Visit (INDEPENDENT_AMBULATORY_CARE_PROVIDER_SITE_OTHER): Payer: Managed Care, Other (non HMO) | Admitting: Family Medicine

## 2016-06-07 VITALS — BP 126/84 | HR 72 | Temp 98.6°F | Resp 16 | Wt 230.0 lb

## 2016-06-07 DIAGNOSIS — G8929 Other chronic pain: Secondary | ICD-10-CM | POA: Diagnosis not present

## 2016-06-07 DIAGNOSIS — M25561 Pain in right knee: Secondary | ICD-10-CM

## 2016-06-07 DIAGNOSIS — Z23 Encounter for immunization: Secondary | ICD-10-CM

## 2016-06-07 DIAGNOSIS — K219 Gastro-esophageal reflux disease without esophagitis: Secondary | ICD-10-CM | POA: Diagnosis not present

## 2016-06-07 DIAGNOSIS — M5126 Other intervertebral disc displacement, lumbar region: Secondary | ICD-10-CM

## 2016-06-07 DIAGNOSIS — M25861 Other specified joint disorders, right knee: Secondary | ICD-10-CM | POA: Diagnosis not present

## 2016-06-07 MED ORDER — OMEPRAZOLE 40 MG PO CPDR: 40.0000 mg | DELAYED_RELEASE_CAPSULE | Freq: Every day | ORAL | 4 refills | Status: DC

## 2016-06-07 NOTE — Progress Notes (Signed)
Patient: Jean Cox Female    DOB: 1961/05/22   55 y.o.   MRN: [id number] Visit Date: 06/07/2016  Today's Provider: Lelon Huh, MD   Chief Complaint  Patient presents with  . Knee Pain    right knee   . Back Pain   Subjective:    HPI Patient present today for an evaluation of knee pain. She reports that the pain is located in her right knee. She reports that she goes to the pain clinic once a month, and they recommended that she be referred to ortho to further treat her chronic knee pain. She had MRI 04/27/2015 Intact ACL with an ACL cyst.  Mild tricompartmental cartilage abnormalities as described above. She states pain has been getting progressive worse and requests evaluation by orthopedics.   She had MRI of L-spine 09/22/15 with finding of New small disc protrusion central and to the left at L4-5 which could affect the left L5 nerve. Slight bilateral facet arthritis at L4-5  She states pain is nearly constant and often radiates down side of right leg causing some weakness in leg.   She also follow up for GERD/HB. She is taking omeprazole 40mg  daily and states reflux symptoms are completely controlled so long as she takes medication every day.     Allergies  Allergen Reactions  . Amitriptyline Hcl     Sedation  . Lexapro  [Escitalopram]     skittish and suidical  . Lyrica  [Pregabalin]     Nightmares  . Topamax  [Topiramate]     Other reaction(s): Headache  . Erythromycin Rash     Current Outpatient Prescriptions:  .  amLODipine (NORVASC) 10 MG tablet, TAKE 1 TABLET BY MOUTH DAILY, Disp: 90 tablet, Rfl: 3 .  Aspirin-Acetaminophen-Caffeine (EXCEDRIN PO), Take by mouth as needed., Disp: , Rfl:  .  DULoxetine (CYMBALTA) 30 MG capsule, TAKE 1 CAPSULE BY MOUTH DAILY, Disp: 90 capsule, Rfl: 3 .  Levothyroxine Sodium 125 MCG CAPS, Take 1 tablet by mouth daily., Disp: , Rfl:  .  mupirocin ointment (BACTROBAN) 2 %, Place 1 application into the nose 2 (two) times  daily., Disp: 22 g, Rfl: 3 .  omeprazole (PRILOSEC) 40 MG capsule, Take 1 capsule (40 mg total) by mouth daily., Disp: 30 capsule, Rfl: 3 .  propranolol-hydrochlorothiazide (INDERIDE) 40-25 MG tablet, Take 1 tablet by mouth daily., Disp: , Rfl:  .  Tapentadol HCl (NUCYNTA) 75 MG TABS, Take 75 mg by mouth 4 (four) times daily as needed., Disp: , Rfl:  .  tiZANidine (ZANAFLEX) 2 MG tablet, Take 1 tablet by mouth 2 (two) times daily as needed., Disp: , Rfl: 0 .  ALPRAZolam (XANAX) 0.5 MG tablet, Take 1 tablet (0.5 mg total) by mouth daily. (Patient not taking: Reported on 06/07/2016), Disp: 90 tablet, Rfl: 1 .  metFORMIN (GLUMETZA) 500 MG (MOD) 24 hr tablet, Take 500 mg by mouth daily. , Disp: , Rfl:   Review of Systems  Constitutional: Negative.  Negative for appetite change, chills and fever.  Musculoskeletal: Positive for arthralgias, back pain, gait problem, joint swelling and myalgias.  Skin: Negative.     Social History  Substance Use Topics  . Smoking status: Former Smoker    Packs/day: 1.00    Years: 25.00    Types: Cigarettes    Quit date: 08/24/2003  . Smokeless tobacco: Never Used  . Alcohol use No   Objective:   BP 126/84 (BP Location: Left Arm, Patient Position:  Sitting, Cuff Size: Large)   Pulse 72   Temp 98.6 F (37 C)   Resp 16   Wt 230 lb (104.3 kg)   LMP 01/28/2006   BMI 40.74 kg/m   Physical Exam  General Appearance:    Alert, cooperative, no distress, obese  Eyes:    PERRL, conjunctiva/corneas clear, EOM's intact       Lungs:     Clear to auscultation bilaterally, respirations unlabored  Heart:    Regular rate and rhythm  Neurologic:   Awake, alert, oriented x 3. No apparent focal neurological           defect.           Assessment & Plan:     1. 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 Need for influenza vaccination  - Flu Vaccine QUAD 36+ mos PF IM (Fluarix & Fluzone Quad PF)  3. Cyst  of right knee joint  - Ambulatory referral to Orthopedics  4. Herniated lumbar disc without myelopathy Progressively worsening pain with radiation to right leg.  - Ambulatory referral to Neurosurgery  5. Gastroesophageal reflux disease, esophagitis presence not specified Well controlled.  Continue current medications.   - omeprazole (PRILOSEC) 40 MG capsule; Take 1 capsule (40 mg total) by mouth daily.  Dispense: 90 capsule; Refill: 4  Follow up for diabetes in 8 weeks.       Lelon Huh, MD  [facility]

## 2016-06-08 ENCOUNTER — Encounter: Payer: Self-pay | Admitting: *Deleted

## 2016-06-14 ENCOUNTER — Ambulatory Visit: Payer: Self-pay | Admitting: Orthopedic Surgery

## 2016-06-14 DIAGNOSIS — M25569 Pain in unspecified knee: Secondary | ICD-10-CM | POA: Insufficient documentation

## 2016-07-05 ENCOUNTER — Encounter: Payer: Self-pay | Admitting: Family Medicine

## 2016-07-05 ENCOUNTER — Ambulatory Visit (INDEPENDENT_AMBULATORY_CARE_PROVIDER_SITE_OTHER): Payer: Managed Care, Other (non HMO) | Admitting: Family Medicine

## 2016-07-05 VITALS — BP 140/94 | HR 77 | Temp 98.1°F | Resp 16 | Ht 63.16 in | Wt 236.0 lb

## 2016-07-05 DIAGNOSIS — B351 Tinea unguium: Secondary | ICD-10-CM | POA: Diagnosis not present

## 2016-07-05 DIAGNOSIS — I10 Essential (primary) hypertension: Secondary | ICD-10-CM | POA: Diagnosis not present

## 2016-07-05 DIAGNOSIS — E785 Hyperlipidemia, unspecified: Secondary | ICD-10-CM

## 2016-07-05 DIAGNOSIS — F39 Unspecified mood [affective] disorder: Secondary | ICD-10-CM | POA: Diagnosis not present

## 2016-07-05 DIAGNOSIS — E119 Type 2 diabetes mellitus without complications: Secondary | ICD-10-CM | POA: Diagnosis not present

## 2016-07-05 MED ORDER — TERBINAFINE HCL 250 MG PO TABS: ORAL_TABLET | ORAL | 5 refills | Status: DC

## 2016-07-05 NOTE — Progress Notes (Signed)
Patient: Jean Cox Female    DOB: [birth year]   55 y.o.   MRN: [id number] Visit Date: 07/05/2016  Today's Provider: Lelon Huh, MD   Chief Complaint  Patient presents with  . Nail Problem  . Mood changes   Subjective:    HPI  Complains the left great toenail has been getting thick and deformed over the last year, and is not irritating surrounding skin. She would like medication to treat  Patient also reports mood continues to be labile with frequent 'ups and downs'  She feels like the Cymbalta has helped significantly, but mood swings are still problem. She tried increasing dose to 60mg  but she couldn't tolerate it. She reports extensive family history of depression and what sounds like bipolar disorder and she is interested in seeing a psychiatrist. She reports some family members have done well on Lithium  She also presents for wellness labs required by her employer.   Allergies  Allergen Reactions  . Amitriptyline Hcl     Sedation  . Lexapro  [Escitalopram]     skittish and suidical  . Lyrica  [Pregabalin]     Nightmares  . Topamax  [Topiramate]     Other reaction(s): Headache  . Erythromycin Rash     Current Outpatient Prescriptions:  .  ALPRAZolam (XANAX) 0.5 MG tablet, Take 1 tablet (0.5 mg total) by mouth daily., Disp: 90 tablet, Rfl: 1 .  amLODipine (NORVASC) 10 MG tablet, TAKE 1 TABLET BY MOUTH DAILY, Disp: 90 tablet, Rfl: 3 .  Aspirin-Acetaminophen-Caffeine (EXCEDRIN PO), Take by mouth as needed., Disp: , Rfl:  .  DULoxetine (CYMBALTA) 30 MG capsule, TAKE 1 CAPSULE BY MOUTH DAILY, Disp: 90 capsule, Rfl: 3 .  Levothyroxine Sodium 125 MCG CAPS, Take 1 tablet by mouth daily., Disp: , Rfl:  .  metFORMIN (GLUMETZA) 500 MG (MOD) 24 hr tablet, Take 500 mg by mouth daily. , Disp: , Rfl:  .  mupirocin ointment (BACTROBAN) 2 %, Place 1 application into the nose 2 (two) times daily., Disp: 22 g, Rfl: 3 .  omeprazole (PRILOSEC) 40 MG capsule, Take 1 capsule  (40 mg total) by mouth daily., Disp: 90 capsule, Rfl: 4 .  propranolol-hydrochlorothiazide (INDERIDE) 40-25 MG tablet, Take 1 tablet by mouth daily., Disp: , Rfl:  .  Tapentadol HCl (NUCYNTA) 75 MG TABS, Take 75 mg by mouth 4 (four) times daily as needed., Disp: , Rfl:  .  tiZANidine (ZANAFLEX) 2 MG tablet, Take 1 tablet by mouth 2 (two) times daily as needed., Disp: , Rfl: 0  Review of Systems  Constitutional: Negative for appetite change, chills, fatigue and fever.  Respiratory: Negative for chest tightness and shortness of breath.   Cardiovascular: Negative for chest pain and palpitations.  Gastrointestinal: Negative for abdominal pain, nausea and vomiting.  Neurological: Negative for dizziness and weakness.    Social History  Substance Use Topics  . Smoking status: Former Smoker    Packs/day: 1.00    Years: 25.00    Types: Cigarettes    Quit date: 08/24/2003  . Smokeless tobacco: Never Used  . Alcohol use No   Objective:   BP (!) 140/94 (BP Location: Left Arm, Patient Position: Sitting, Cuff Size: Large)   Pulse 77   Temp 98.1 F (36.7 C) (Oral)   Resp 16   Ht 5' 3.16" (1.604 m)   Wt 236 lb (107 kg)   LMP 01/28/2006   SpO2 96%   BMI 41.59 kg/m  Physical Exam  General Appearance:    Alert, cooperative, no distress, obese  Eyes:    PERRL, conjunctiva/corneas clear, EOM's intact       Lungs:     Clear to auscultation bilaterally, respirations unlabored  Heart:    Regular rate and rhythm  Neurologic:   Awake, alert, oriented x 3. No apparent focal neurological           defect.           Assessment & Plan:     1. Onychomycosis Discussed potential risks of long term antifungal. Will try pulse dosing of Lamisil - terbinafine (LAMISIL) 250 MG tablet; Take 2 tablet daily for 7 days every 4 weeks (1 week a month)  Dispense: 28 tablet; Refill: 5  2. Mood disorder ([place]) Somewhat better on low dose of Cymbalta, but did not tolerating higher doses. Consider family history  of psychiatric disease she wishes to establish with psychiatrist.  - Ambulatory referral to Psychiatry  3. Essential hypertension Stable.  Continue current medications.   - Renal function panel - TSH  4. Type 2 diabetes mellitus without complication, unspecified long term insulin use status (HCC) Lab Results  Component Value Date   HGBA1C 7.5 (H) 03/23/2016    - TSH  5. Hyperlipidemia, unspecified hyperlipidemia type Not currently on lipid lower medication.  - Lipid panel       Lelon Huh, MD  [facility]

## 2016-07-06 ENCOUNTER — Telehealth: Payer: Self-pay

## 2016-07-06 DIAGNOSIS — E039 Hypothyroidism, unspecified: Secondary | ICD-10-CM

## 2016-07-06 LAB — RENAL FUNCTION PANEL
Albumin: 4.4 g/dL (ref 3.5–5.5)
BUN/Creatinine Ratio: 25 — ABNORMAL HIGH (ref 9–23)
BUN: 17 mg/dL (ref 6–24)
CO2: 26 mmol/L (ref 18–29)
CREATININE: 0.69 mg/dL (ref 0.57–1.00)
Calcium: 9.8 mg/dL (ref 8.7–10.2)
Chloride: 96 mmol/L (ref 96–106)
GFR calc Af Amer: 115 mL/min/{1.73_m2} (ref >59)
GFR, EST NON AFRICAN AMERICAN: 100 mL/min/{1.73_m2} (ref >59)
Glucose: 142 mg/dL — ABNORMAL HIGH (ref 65–99)
PHOSPHORUS: 4 mg/dL (ref 2.5–4.5)
Potassium: 4 mmol/L (ref 3.5–5.2)
SODIUM: 140 mmol/L (ref 134–144)

## 2016-07-06 LAB — LIPID PANEL
CHOL/HDL RATIO: 3.8 ratio (ref 0.0–4.4)
Cholesterol, Total: 203 mg/dL — ABNORMAL HIGH (ref 100–199)
HDL: 54 mg/dL (ref >39)
LDL CALC: 116 mg/dL — AB (ref 0–99)
TRIGLYCERIDES: 167 mg/dL — AB (ref 0–149)
VLDL Cholesterol Cal: 33 mg/dL (ref 5–40)

## 2016-07-06 LAB — TSH: TSH: 9.59 u[IU]/mL — ABNORMAL HIGH (ref 0.450–4.500)

## 2016-07-06 NOTE — Telephone Encounter (Signed)
-----   Message from Birdie Sons, MD sent at 07/06/2016  8:06 AM EST ----- Blood sugar, kidney functions, electrolytes, and cholesterol are all normal. Is a little hyPOthyroid. Increase levothyroxine to 136mcg daily, #30, rf x 3  Schedule follow up in 3 months.

## 2016-07-06 NOTE — Telephone Encounter (Signed)
Patient was advised she states that she follow with Dr. Jena Gauss for her thyroid and three weeks ago she was increased to Levothyroxine 184mcg. Patient states since the increase she has experience extreme fatigue. Patient wants to know do you want her to follow Dr. Claris Pong recommendation of 175mcg or your directions to increase to 155mcg? Please advise. KW

## 2016-07-06 NOTE — Telephone Encounter (Signed)
Continue 137 prescribed by Dr. Gabriel Carina. Usually take 6-12 weeks for new dose to level off.

## 2016-07-06 NOTE — Telephone Encounter (Signed)
Unable to reach patient at this time voicemail box is fill will try and contact patient again at a later time. KW

## 2016-07-07 NOTE — Telephone Encounter (Signed)
Patients voicemail box is full will try again at a later time. KW

## 2016-07-09 NOTE — Telephone Encounter (Signed)
Patient was advised. KW 

## 2016-07-28 ENCOUNTER — Ambulatory Visit: Payer: Managed Care, Other (non HMO) | Admitting: Family Medicine

## 2016-09-22 ENCOUNTER — Other Ambulatory Visit: Payer: Self-pay | Admitting: Family Medicine

## 2016-09-22 DIAGNOSIS — B351 Tinea unguium: Secondary | ICD-10-CM

## 2016-09-22 MED ORDER — TERBINAFINE HCL 250 MG PO TABS: ORAL_TABLET | ORAL | 5 refills | Status: DC

## 2016-09-22 NOTE — Telephone Encounter (Signed)
Pt contacted office for refill request on the following medications: terbinafine (LAMISIL) 250 MG tablet Pt stated she has to use [facility] order and would like the Rx sent to them. Please advise. Thanks TNP

## 2017-03-10 ENCOUNTER — Other Ambulatory Visit: Payer: Self-pay

## 2017-03-10 ENCOUNTER — Other Ambulatory Visit: Payer: Self-pay | Admitting: Family Medicine

## 2017-03-10 NOTE — Telephone Encounter (Signed)
Please review. Thanks!  

## 2017-03-10 NOTE — Telephone Encounter (Signed)
Patient is requesting Duloxetine and Propranolol-HCTZ be sent to [facility]. CB# [phone]

## 2017-03-11 MED ORDER — PROPRANOLOL-HCTZ 40-25 MG PO TABS: 1.0000 | ORAL_TABLET | Freq: Every day | ORAL | 0 refills | Status: DC

## 2017-03-11 MED ORDER — DULOXETINE HCL 30 MG PO CPEP: 30.0000 mg | ORAL_CAPSULE | Freq: Every day | ORAL | 3 refills | Status: DC

## 2017-03-11 NOTE — Telephone Encounter (Signed)
Please review-aa 

## 2017-04-19 ENCOUNTER — Encounter: Payer: Self-pay | Admitting: Family Medicine

## 2017-04-19 ENCOUNTER — Ambulatory Visit (INDEPENDENT_AMBULATORY_CARE_PROVIDER_SITE_OTHER): Payer: Managed Care, Other (non HMO) | Admitting: Family Medicine

## 2017-04-19 VITALS — BP 150/104 | HR 96 | Temp 97.9°F | Resp 16 | Wt 222.0 lb

## 2017-04-19 DIAGNOSIS — Z23 Encounter for immunization: Secondary | ICD-10-CM

## 2017-04-19 DIAGNOSIS — L659 Nonscarring hair loss, unspecified: Secondary | ICD-10-CM

## 2017-04-19 DIAGNOSIS — F419 Anxiety disorder, unspecified: Secondary | ICD-10-CM

## 2017-04-19 DIAGNOSIS — E119 Type 2 diabetes mellitus without complications: Secondary | ICD-10-CM

## 2017-04-19 DIAGNOSIS — E039 Hypothyroidism, unspecified: Secondary | ICD-10-CM

## 2017-04-19 MED ORDER — METFORMIN HCL ER 500 MG PO TB24: 500.0000 mg | ORAL_TABLET | Freq: Every day | ORAL | 1 refills | Status: DC

## 2017-04-19 MED ORDER — BUSPIRONE HCL 7.5 MG PO TABS: ORAL_TABLET | ORAL | 1 refills | Status: DC

## 2017-04-19 NOTE — Progress Notes (Signed)
Patient: Jean Cox Female    DOB: 06/26/1961   56 y.o.   MRN: [id number] Visit Date: 04/19/2017  Today's Provider: Lelon Huh, MD   Chief Complaint  Patient presents with  . Alopecia   Subjective:    Patient stated since reducing Nucynta dose and increasing tizanidine, her hair has been coming out in brush fulls. She stopped taking tizanidine last week last week but is still losing hair. Hair loss started about 2 months ago.  Also wants to discuss anxiety medication.   Was taken off alprazolam last year by pain clinic. Has been much worse the last few months Was previously taking 60mg  Cymbalta but didn't tolerate it. She feels like she is barely tolerating the 30mg  cymbalta  Lab Results  Component Value Date   TSH 9.590 (H) 07/05/2016      Lab Results  Component Value Date   HGBA1C 7.5 (H) 03/23/2016  She was prescribed metformin last year, but only took for a month. She had no adverse effects, but states she is in denial about having diabetes.    Allergies  Allergen Reactions  . Amitriptyline Hcl     Sedation  . Lexapro  [Escitalopram]     skittish and suidical  . Lyrica  [Pregabalin]     Nightmares  . Topamax  [Topiramate]     Other reaction(s): Headache  . Erythromycin Rash     Current Outpatient Prescriptions:  .  amLODipine (NORVASC) 10 MG tablet, TAKE 1 TABLET BY MOUTH DAILY, Disp: 90 tablet, Rfl: 3 .  Aspirin-Acetaminophen-Caffeine (EXCEDRIN PO), Take by mouth as needed., Disp: , Rfl:  .  DULoxetine (CYMBALTA) 30 MG capsule, Take 1 capsule (30 mg total) by mouth daily., Disp: 90 capsule, Rfl: 3 .  Levothyroxine Sodium 125 MCG CAPS, Take 1 tablet by mouth daily., Disp: , Rfl:  .  mupirocin ointment (BACTROBAN) 2 %, APPLY 1 APPLICATION INTO THE NOSE TWICE DAILY AS DIRECTED, Disp: 66 g, Rfl: 4 .  NUCYNTA ER 50 MG 12 hr tablet, Take 50 mg by mouth 4 (four) times daily., Disp: , Rfl:  .  omeprazole (PRILOSEC) 40 MG capsule, Take 1 capsule (40 mg  total) by mouth daily., Disp: 90 capsule, Rfl: 4 .  propranolol-hydrochlorothiazide (INDERIDE) 40-25 MG tablet, Take 1 tablet by mouth daily., Disp: 30 tablet, Rfl: 0 .  tapentadol (NUCYNTA ER) 100 MG 12 hr tablet, Take 50 mg by mouth 2 (two) times daily., Disp: , Rfl:  .  terbinafine (LAMISIL) 250 MG tablet, Take 2 tablet daily for 7 days every 4 weeks (1 week a month), Disp: 28 tablet, Rfl: 5 .  tiZANidine (ZANAFLEX) 2 MG tablet, Take 1 tablet by mouth 2 (two) times daily as needed., Disp: , Rfl: 0 .  metFORMIN (GLUMETZA) 500 MG (MOD) 24 hr tablet, Take 500 mg by mouth daily. , Disp: , Rfl:   Review of Systems  Constitutional: Negative for appetite change, chills, fatigue and fever.  Respiratory: Negative for chest tightness and shortness of breath.   Cardiovascular: Negative for chest pain and palpitations.  Gastrointestinal: Negative for abdominal pain, nausea and vomiting.  Neurological: Negative for dizziness and weakness.    Social History  Substance Use Topics  . Smoking status: Former Smoker    Packs/day: 1.00    Years: 25.00    Types: Cigarettes    Quit date: 08/24/2003  . Smokeless tobacco: Never Used  . Alcohol use No   Objective:   LMP  (  LMP Unknown)  Vitals:   04/19/17 1638 04/19/17 1642  BP: (!) 150/104 (!) 150/104  Pulse: 96   Resp: 16   Temp: 97.9 F (36.6 C)   TempSrc: Oral   SpO2: 94%   Weight: 222 lb (100.7 kg)      Physical Exam   General Appearance:    Alert, cooperative, no distress, obese  Eyes:    PERRL, conjunctiva/corneas clear, EOM's intact       Lungs:     Clear to auscultation bilaterally, respirations unlabored  Heart:    Regular rate and rhythm  Neurologic:   Awake, alert, oriented x 3. No apparent focal neurological           defect.   Skin:  dramatica thinning of hair density along sides and posterior scalp. No other lesions.        Assessment & Plan:     1. Alopecia Suspect this is part of her anxiety. Check labs. Consider  dermatology referral if not improving when anxiety is better controlled. - Comprehensive metabolic panel - CBC - T4 AND TSH - Ferritin  2. Acute anxiety Off alprazolam due to potential interaction with opioids. Try buspirone  - busPIRone (BUSPAR) 7.5 MG tablet; Start 1 tablet daily for 4 days, then 1 twice daily for 4 days, then 2 twice daily for 4 days, as tolerated  Dispense: 60 tablet; Refill: 1  3. Hypothyroidism, unspecified type -TSH  4. Type 2 diabetes mellitus without complication, unspecified whether long term insulin use (HCC) Off of metformin for nearly a year. Will check baseline a1c and restart metformin.  - metFORMIN (GLUCOPHAGE-XR) 500 MG 24 hr tablet; Take 1 tablet (500 mg total) by mouth daily with breakfast.  Dispense: 30 tablet; Refill: 1 - Hemoglobin A1c  5. Need for influenza vaccination  - Flu Vaccine QUAD 36+ mos IM  Return in about 3 weeks (around 05/10/2017).       Lelon Huh, MD  [facility]

## 2017-04-21 ENCOUNTER — Other Ambulatory Visit: Payer: Self-pay | Admitting: Family Medicine

## 2017-04-21 ENCOUNTER — Telehealth: Payer: Self-pay

## 2017-04-21 DIAGNOSIS — E119 Type 2 diabetes mellitus without complications: Secondary | ICD-10-CM

## 2017-04-21 LAB — CBC
Hematocrit: 42.6 % (ref 34.0–46.6)
Hemoglobin: 14 g/dL (ref 11.1–15.9)
MCH: 27.9 pg (ref 26.6–33.0)
MCHC: 32.9 g/dL (ref 31.5–35.7)
MCV: 85 fL (ref 79–97)
PLATELETS: 385 10*3/uL — AB (ref 150–379)
RBC: 5.02 x10E6/uL (ref 3.77–5.28)
RDW: 13.4 % (ref 12.3–15.4)
WBC: 15.8 10*3/uL — AB (ref 3.4–10.8)

## 2017-04-21 LAB — COMPREHENSIVE METABOLIC PANEL
ALBUMIN: 4.3 g/dL (ref 3.5–5.5)
ALK PHOS: 129 IU/L — AB (ref 39–117)
ALT: 17 IU/L (ref 0–32)
AST: 12 IU/L (ref 0–40)
Albumin/Globulin Ratio: 1.3 (ref 1.2–2.2)
BUN / CREAT RATIO: 22 (ref 9–23)
BUN: 15 mg/dL (ref 6–24)
Bilirubin Total: 0.4 mg/dL (ref 0.0–1.2)
CO2: 24 mmol/L (ref 20–29)
CREATININE: 0.68 mg/dL (ref 0.57–1.00)
Calcium: 9.5 mg/dL (ref 8.7–10.2)
Chloride: 90 mmol/L — ABNORMAL LOW (ref 96–106)
GFR, EST AFRICAN AMERICAN: 115 mL/min/{1.73_m2} (ref >59)
GFR, EST NON AFRICAN AMERICAN: 100 mL/min/{1.73_m2} (ref >59)
GLOBULIN, TOTAL: 3.2 g/dL (ref 1.5–4.5)
Glucose: 396 mg/dL — ABNORMAL HIGH (ref 65–99)
Potassium: 3.7 mmol/L (ref 3.5–5.2)
SODIUM: 133 mmol/L — AB (ref 134–144)
TOTAL PROTEIN: 7.5 g/dL (ref 6.0–8.5)

## 2017-04-21 LAB — HEMOGLOBIN A1C
ESTIMATED AVERAGE GLUCOSE: 315 mg/dL
Hgb A1c MFr Bld: 12.6 % — ABNORMAL HIGH (ref 4.8–5.6)

## 2017-04-21 LAB — FERRITIN: Ferritin: 141 ng/mL (ref 15–150)

## 2017-04-21 LAB — T4 AND TSH
T4, Total: 10.2 ug/dL (ref 4.5–12.0)
TSH: 3.02 u[IU]/mL (ref 0.450–4.500)

## 2017-04-21 MED ORDER — METFORMIN HCL ER 500 MG PO TB24: 500.0000 mg | ORAL_TABLET | Freq: Two times a day (BID) | ORAL | 0 refills | Status: DC

## 2017-04-21 NOTE — Telephone Encounter (Signed)
Advised patient of results. Order for [facility] has been placed.

## 2017-04-21 NOTE — Telephone Encounter (Signed)
-----   Message from Birdie Sons, MD sent at 04/21/2017  7:45 AM EDT ----- hgba1c is 12. Average blood sugar is very high at 315. Need to take metformin once a day for a week, then increase to BID. Need referral to [facility] for diabetes. Schedule follow up o.v. In 6 weeks.

## 2017-04-22 ENCOUNTER — Other Ambulatory Visit: Payer: Self-pay | Admitting: Family Medicine

## 2017-05-09 ENCOUNTER — Other Ambulatory Visit: Payer: Self-pay | Admitting: Family Medicine

## 2017-05-09 DIAGNOSIS — E119 Type 2 diabetes mellitus without complications: Secondary | ICD-10-CM

## 2017-05-09 MED ORDER — METFORMIN HCL ER 500 MG PO TB24: 500.0000 mg | ORAL_TABLET | Freq: Two times a day (BID) | ORAL | 4 refills | Status: DC

## 2017-05-09 NOTE — Telephone Encounter (Signed)
Pt contacted office for refill request on the following medications:  metFORMIN (GLUCOPHAGE-XR) 500 MG 24 hr tablet   [employer].  CB#[phone]

## 2017-05-10 ENCOUNTER — Other Ambulatory Visit: Payer: Self-pay | Admitting: Family Medicine

## 2017-05-10 DIAGNOSIS — E119 Type 2 diabetes mellitus without complications: Secondary | ICD-10-CM

## 2017-05-10 MED ORDER — METFORMIN HCL ER 500 MG PO TB24: 500.0000 mg | ORAL_TABLET | Freq: Two times a day (BID) | ORAL | 4 refills | Status: DC

## 2017-05-18 ENCOUNTER — Other Ambulatory Visit: Payer: Self-pay | Admitting: Family Medicine

## 2017-05-18 DIAGNOSIS — E119 Type 2 diabetes mellitus without complications: Secondary | ICD-10-CM

## 2017-05-18 DIAGNOSIS — F419 Anxiety disorder, unspecified: Secondary | ICD-10-CM

## 2017-05-19 NOTE — Telephone Encounter (Signed)
Received refill request for buspirone, please check with patient to see exactly how she is currently taking, how well it is working and if she wants to continue the same dose.

## 2017-05-23 NOTE — Telephone Encounter (Signed)
Spoke with patient on the phone he states that the take two tablet in the AM. She reports that she feels the medication isnt helping much with symptoms and states that she is still very anxious. Patient request to stay on same dose to see if there are any changes over the next 30days and states that she will call to schedule follow up appt afterwards to discuss changes if needed. KW

## 2017-05-25 ENCOUNTER — Encounter: Payer: Self-pay | Admitting: Family Medicine

## 2017-05-25 ENCOUNTER — Ambulatory Visit (INDEPENDENT_AMBULATORY_CARE_PROVIDER_SITE_OTHER): Payer: Managed Care, Other (non HMO) | Admitting: Family Medicine

## 2017-05-25 VITALS — BP 126/82 | HR 72 | Temp 97.9°F | Resp 16 | Ht 63.0 in | Wt 225.0 lb

## 2017-05-25 DIAGNOSIS — H9201 Otalgia, right ear: Secondary | ICD-10-CM

## 2017-05-25 MED ORDER — NAPROXEN 500 MG PO TABS: 500.0000 mg | ORAL_TABLET | Freq: Two times a day (BID) | ORAL | 0 refills | Status: AC

## 2017-05-25 NOTE — Progress Notes (Signed)
Patient: Jean Cox Female    DOB: December 06, 1960   56 y.o.   MRN: [id number] Visit Date: 05/25/2017  Today's Provider: Lelon Huh, MD   Chief Complaint  Patient presents with  . Ear Pain  . Diabetes   Subjective:    HPI Patient comes in today c/o right ear pain X 4 days. She reports that the pain is intermittent.  She denies any URI symptoms. Denies fever. She does have history of allergies, but has not been bothering her lately. She has not taken anything OTC for symptoms. No hearing difficulties, feels like pain is coming from behind ear        Diabetes Mellitus Type II, Follow-up:   Lab Results  Component Value Date   HGBA1C 12.6 (H) 04/20/2017   HGBA1C 7.5 (H) 03/23/2016   HGBA1C 6.8 (A) 12/26/2014    Last seen for diabetes 6 weeks ago.  Management since then includes restarting back Metformin 500mg  twice daily. She reports good compliance with treatment. She is not having side effects.  Current symptoms include none and have been stable. Home blood sugar records: not being checked  Episodes of hypoglycemia? no   Current Insulin Regimen: none Most Recent Eye Exam: due Weight trend: stable Prior visit with dietician: no Current diet: well balanced Current exercise: walking  Pertinent Labs:    Component Value Date/Time   CHOL 203 (H) 07/05/2016 1027   TRIG 167 (H) 07/05/2016 1027   HDL 54 07/05/2016 1027   LDLCALC 116 (H) 07/05/2016 1027   CREATININE 0.68 04/20/2017 1411    Wt Readings from Last 3 Encounters:  05/25/17 225 lb (102.1 kg)  04/19/17 222 lb (100.7 kg)  07/05/16 236 lb (107 kg)   Is scheduled to see Dr. Gabriel Carina ib 10/16     Allergies  Allergen Reactions  . Amitriptyline Hcl     Sedation  . Lexapro  [Escitalopram]     skittish and suidical  . Lyrica  [Pregabalin]     Nightmares  . Topamax  [Topiramate]     Other reaction(s): Headache  . Erythromycin Rash     Current Outpatient Prescriptions:  .  amLODipine  (NORVASC) 10 MG tablet, TAKE 1 TABLET BY MOUTH DAILY, Disp: 90 tablet, Rfl: 3 .  Aspirin-Acetaminophen-Caffeine (EXCEDRIN PO), Take by mouth as needed., Disp: , Rfl:  .  busPIRone (BUSPAR) 15 MG tablet, Take 1 tablet (15 mg total) by mouth 2 (two) times daily., Disp: 60 tablet, Rfl: 3 .  DULoxetine (CYMBALTA) 30 MG capsule, Take 1 capsule (30 mg total) by mouth daily., Disp: 90 capsule, Rfl: 3 .  Levothyroxine Sodium 125 MCG CAPS, Take 1 tablet by mouth daily., Disp: , Rfl:  .  metFORMIN (GLUCOPHAGE-XR) 500 MG 24 hr tablet, TAKE 1 TABLET BY MOUTH EVERY DAY WITH breakfast, Disp: 30 tablet, Rfl: 5 .  mupirocin ointment (BACTROBAN) 2 %, APPLY 1 APPLICATION INTO THE NOSE TWICE DAILY AS DIRECTED, Disp: 66 g, Rfl: 4 .  NUCYNTA ER 50 MG 12 hr tablet, Take 50 mg by mouth 4 (four) times daily., Disp: , Rfl:  .  omeprazole (PRILOSEC) 40 MG capsule, Take 1 capsule (40 mg total) by mouth daily., Disp: 90 capsule, Rfl: 4 .  propranolol-hydrochlorothiazide (INDERIDE) 40-25 MG tablet, TAKE 1 TABLET BY MOUTH DAILY (NEED TO SCHEDULE OFFICE VISIT FOR FOLLOW UP), Disp: 30 tablet, Rfl: 3 .  tapentadol (NUCYNTA ER) 100 MG 12 hr tablet, Take 50 mg by mouth 2 (two) times daily.,  Disp: , Rfl:  .  tiZANidine (ZANAFLEX) 2 MG tablet, Take 1 tablet by mouth 2 (two) times daily as needed., Disp: , Rfl: 0 .  terbinafine (LAMISIL) 250 MG tablet, Take 2 tablet daily for 7 days every 4 weeks (1 week a month) (Patient not taking: Reported on 05/25/2017), Disp: 28 tablet, Rfl: 5  Review of Systems  Constitutional: Negative for fatigue and fever.  HENT: Positive for ear pain and postnasal drip. Negative for ear discharge, hearing loss, sinus pain, sinus pressure, sneezing, sore throat and tinnitus.   Respiratory: Negative for cough.   Cardiovascular: Negative for chest pain, palpitations and leg swelling.  Endocrine: Negative.   Musculoskeletal: Negative.     Social History  Substance Use Topics  . Smoking status: Former Smoker     Packs/day: 1.00    Years: 25.00    Types: Cigarettes    Quit date: 08/24/2003  . Smokeless tobacco: Never Used  . Alcohol use No   Objective:   BP 126/82 (BP Location: Right Arm, Patient Position: Sitting, Cuff Size: Large)   Pulse 72   Temp 97.9 F (36.6 C)   Resp 16   Ht 5\' 3"  (1.6 m)   Wt 225 lb (102.1 kg)   LMP  (LMP Unknown)   SpO2 94%   BMI 39.86 kg/m  Vitals:   05/25/17 1459  BP: 126/82  Pulse: 72  Resp: 16  Temp: 97.9 F (36.6 C)  SpO2: 94%  Weight: 225 lb (102.1 kg)  Height: 5\' 3"  (1.6 m)     Physical Exam  General Appearance:    Alert, cooperative, no distress  HENT:   ENT exam normal, no neck nodes or sinus tenderness, pre and post auricular nodes not enlarged or tender  Eyes:    PERRL, conjunctiva/corneas clear, EOM's intact       Lungs:     Clear to auscultation bilaterally, respirations unlabored  Heart:    Regular rate and rhythm  Neurologic:   Awake, alert, oriented x 3. No apparent focal neurological           defect.           Assessment & Plan:     1. Right ear pain No clear sign of infectious process. Normal TM. Try brief course of NSAID. Consider antibiotic if not improving in a few days.       Lelon Huh, MD  [facility]

## 2017-06-10 ENCOUNTER — Telehealth: Payer: Self-pay | Admitting: Family Medicine

## 2017-06-10 NOTE — Telephone Encounter (Signed)
Please advise patient it is time to follow up for diabetes. Needs to schedule office visit before end of October. Thanks.

## 2017-06-13 NOTE — Telephone Encounter (Signed)
No answer ans unable to leave vm. Will try again later.

## 2017-06-14 NOTE — Telephone Encounter (Signed)
Tried calling pt again, no answer.

## 2017-06-21 NOTE — Telephone Encounter (Signed)
Unable to contact patient. Will save message to chart.  

## 2017-07-27 ENCOUNTER — Ambulatory Visit: Payer: Managed Care, Other (non HMO) | Admitting: Family Medicine

## 2017-07-27 ENCOUNTER — Encounter: Payer: Self-pay | Admitting: Family Medicine

## 2017-07-27 VITALS — BP 126/76 | Temp 98.5°F | Resp 16 | Wt 222.0 lb

## 2017-07-27 DIAGNOSIS — L600 Ingrowing nail: Secondary | ICD-10-CM | POA: Diagnosis not present

## 2017-07-27 MED ORDER — AMOXICILLIN-POT CLAVULANATE 875-125 MG PO TABS: 1.0000 | ORAL_TABLET | Freq: Two times a day (BID) | ORAL | 0 refills | Status: AC

## 2017-07-27 NOTE — Patient Instructions (Signed)
Ingrown Toenail An ingrown toenail occurs when the corner or sides of your toenail grow into the surrounding skin. The big toe is most commonly affected, but it can happen to any of your toes. If your ingrown toenail is not treated, you will be at risk for infection. What are the causes? This condition may be caused by:  Wearing shoes that are too small or tight.  Injury or trauma, such as stubbing your toe or having your toe stepped on.  Improper cutting or care of your toenails.  Being born with (congenital) nail or foot abnormalities, such as having a nail that is too big for your toe.  What increases the risk? Risk factors for an ingrown toenail include:  Age. Your nails tend to thicken as you get older, so ingrown nails are more common in older people.  Diabetes.  Cutting your toenails incorrectly.  Blood circulation problems.  What are the signs or symptoms? Symptoms may include:  Pain, soreness, or tenderness.  Redness.  Swelling.  Hardening of the skin surrounding the toe.  Your ingrown toenail may be infected if there is fluid, pus, or drainage. How is this diagnosed? An ingrown toenail may be diagnosed by medical history and physical exam. If your toenail is infected, your health care provider may test a sample of the drainage. How is this treated? Treatment depends on the severity of your ingrown toenail. Some ingrown toenails may be treated at home. More severe or infected ingrown toenails may require surgery to remove all or part of the nail. Infected ingrown toenails may also be treated with antibiotic medicines. Follow these instructions at home:  If you were prescribed an antibiotic medicine, finish all of it even if you start to feel better.  Soak your foot in warm soapy water for 20 minutes, 3 times per day or as directed by your health care provider.  Carefully lift the edge of the nail away from the sore skin by wedging a small piece of cotton under  the corner of the nail. This may help with the pain. Be careful not to cause more injury to the area.  Wear shoes that fit well. If your ingrown toenail is causing you pain, try wearing sandals, if possible.  Trim your toenails regularly and carefully. Do not cut them in a curved shape. Cut your toenails straight across. This prevents injury to the skin at the corners of the toenail.  Keep your feet clean and dry.  If you are having trouble walking and are given crutches by your health care provider, use them as directed.  Do not pick at your toenail or try to remove it yourself.  Take medicines only as directed by your health care provider.  Keep all follow-up visits as directed by your health care provider. This is important. Contact a health care provider if:  Your symptoms do not improve with treatment. Get help right away if:  You have red streaks that start at your foot and go up your leg.  You have a fever.  You have increased redness, swelling, or pain.  You have fluid, blood, or pus coming from your toenail. This information is not intended to replace advice given to you by your health care provider. Make sure you discuss any questions you have with your health care provider. Document Released: 08/06/2000 Document Revised: 01/09/2016 Document Reviewed: 07/03/2014 Elsevier Interactive Patient Education  2018 Elsevier Inc.  

## 2017-07-27 NOTE — Progress Notes (Signed)
Patient: Jean Cox Female    DOB: [birth year]   56 y.o.   MRN: [id number] Visit Date: 07/27/2017  Today's Provider: Lelon Huh, MD   Chief Complaint  Patient presents with  . Toe Pain  . Sore Throat   Subjective:    HPI Patient comes in today c/o ingrown toenail on her left great toe for a few weeks. She reports that it becoming more painful to wear her shoes. She has not used anything OTC for her symptoms.   Was started on Lamisil for onychomycosis.   She also woke up this morning with a sore throat. Denies fever. She has not taken anything OTC for her symptoms.    Allergies  Allergen Reactions  . Amitriptyline Hcl     Sedation  . Lexapro  [Escitalopram]     skittish and suidical  . Lyrica  [Pregabalin]     Nightmares  . Topamax  [Topiramate]     Other reaction(s): Headache  . Erythromycin Rash     Current Outpatient Medications:  .  amLODipine (NORVASC) 10 MG tablet, TAKE 1 TABLET BY MOUTH DAILY, Disp: 90 tablet, Rfl: 3 .  Aspirin-Acetaminophen-Caffeine (EXCEDRIN PO), Take by mouth as needed., Disp: , Rfl:  .  busPIRone (BUSPAR) 15 MG tablet, Take 1 tablet (15 mg total) by mouth 2 (two) times daily., Disp: 60 tablet, Rfl: 3 .  DULoxetine (CYMBALTA) 30 MG capsule, Take 1 capsule (30 mg total) by mouth daily., Disp: 90 capsule, Rfl: 3 .  levorphanol (LEVODROMORAN) 2 MG tablet, Take 2 mg by mouth every 8 (eight) hours as needed for pain., Disp: , Rfl:  .  Levothyroxine Sodium 125 MCG CAPS, Take 1 tablet by mouth daily., Disp: , Rfl:  .  metFORMIN (GLUCOPHAGE-XR) 500 MG 24 hr tablet, TAKE 1 TABLET BY MOUTH EVERY DAY WITH breakfast, Disp: 30 tablet, Rfl: 5 .  naproxen (NAPROSYN) 500 MG tablet, Take 1 tablet (500 mg total) by mouth 2 (two) times daily with a meal., Disp: 14 tablet, Rfl: 0 .  omeprazole (PRILOSEC) 40 MG capsule, Take 1 capsule (40 mg total) by mouth daily., Disp: 90 capsule, Rfl: 4 .  OxyCODONE HCl, Abuse Deter, (OXAYDO) 5 MG TABA, Take 5  tablets by mouth 2 (two) times daily as needed., Disp: , Rfl:  .  propranolol-hydrochlorothiazide (INDERIDE) 40-25 MG tablet, TAKE 1 TABLET BY MOUTH DAILY (NEED TO SCHEDULE OFFICE VISIT FOR FOLLOW UP), Disp: 30 tablet, Rfl: 3 .  tiZANidine (ZANAFLEX) 2 MG tablet, Take 1 tablet by mouth 2 (two) times daily as needed., Disp: , Rfl: 0 .  mupirocin ointment (BACTROBAN) 2 %, APPLY 1 APPLICATION INTO THE NOSE TWICE DAILY AS DIRECTED (Patient not taking: Reported on 07/27/2017), Disp: 66 g, Rfl: 4 .  NUCYNTA ER 50 MG 12 hr tablet, Take 50 mg by mouth 4 (four) times daily., Disp: , Rfl:  .  tapentadol (NUCYNTA ER) 100 MG 12 hr tablet, Take 50 mg by mouth 2 (two) times daily., Disp: , Rfl:  .  terbinafine (LAMISIL) 250 MG tablet, Take 2 tablet daily for 7 days every 4 weeks (1 week a month) (Patient not taking: Reported on 05/25/2017), Disp: 28 tablet, Rfl: 5  Review of Systems  Constitutional: Negative for appetite change, chills, fatigue and fever.  Respiratory: Negative for chest tightness and shortness of breath.   Cardiovascular: Negative for chest pain and palpitations.  Gastrointestinal: Negative for abdominal pain, nausea and vomiting.  Neurological: Negative for dizziness and  weakness.    Social History   Tobacco Use  . Smoking status: Former Smoker    Packs/day: 1.00    Years: 25.00    Pack years: 25.00    Types: Cigarettes    Last attempt to quit: 08/24/2003    Years since quitting: 13.9  . Smokeless tobacco: Never Used  Substance Use Topics  . Alcohol use: No    Alcohol/week: 0.0 oz   Objective:   BP 126/76 (BP Location: Right Arm, Patient Position: Sitting, Cuff Size: Normal)   Temp 98.5 F (36.9 C)   Resp 16   Wt 222 lb (100.7 kg)   LMP  (LMP Unknown)   BMI 39.33 kg/m  Vitals:   07/27/17 1415  BP: 126/76  Resp: 16  Temp: 98.5 F (36.9 C)  Weight: 222 lb (100.7 kg)     Physical Exam  General appearance: alert, well developed, well nourished, cooperative and in no  distress Head: Normocephalic, without obvious abnormality, atraumatic Respiratory: Respirations even and unlabored, normal respiratory rate Ext: tender, red swollen left great toe with yellow drainage and ingrown toenail.      Assessment & Plan:     1. Ingrown toenail of left foot with infection  - amoxicillin-clavulanate (AUGMENTIN) 875-125 MG tablet; Take 1 tablet by mouth 2 (two) times daily for 10 days.  Dispense: 20 tablet; Refill: 0  Discusses soaking in epsom salts. Call for podiatry referral if not completely resolved when finished with antibiotic.       Lelon Huh, MD  [facility]

## 2017-08-11 ENCOUNTER — Telehealth: Payer: Self-pay | Admitting: *Deleted

## 2017-08-11 DIAGNOSIS — L600 Ingrowing nail: Secondary | ICD-10-CM

## 2017-08-11 NOTE — Telephone Encounter (Signed)
Patient was seen in office 07/27/2017 for ingrown toenail. Patient states the infection cleared up however toenail is still very painful. Patient is requesting a referral to podiatry. Please advise?

## 2017-08-11 NOTE — Telephone Encounter (Signed)
Have sent order for podiatry referral to [place].

## 2017-08-12 NOTE — Telephone Encounter (Signed)
Patient was advised.  

## 2017-11-17 ENCOUNTER — Other Ambulatory Visit: Payer: Self-pay | Admitting: Family Medicine

## 2017-11-17 DIAGNOSIS — K219 Gastro-esophageal reflux disease without esophagitis: Secondary | ICD-10-CM

## 2017-11-17 DIAGNOSIS — B351 Tinea unguium: Secondary | ICD-10-CM

## 2017-11-17 MED ORDER — TERBINAFINE HCL 250 MG PO TABS: ORAL_TABLET | ORAL | 1 refills | Status: DC

## 2017-11-17 MED ORDER — PROPRANOLOL-HCTZ 40-25 MG PO TABS: ORAL_TABLET | ORAL | 2 refills | Status: DC

## 2017-11-17 MED ORDER — OMEPRAZOLE 40 MG PO CPDR: 40.0000 mg | DELAYED_RELEASE_CAPSULE | Freq: Every day | ORAL | 4 refills | Status: DC

## 2017-11-17 MED ORDER — AMLODIPINE BESYLATE 10 MG PO TABS: 10.0000 mg | ORAL_TABLET | Freq: Every day | ORAL | 3 refills | Status: DC

## 2017-11-17 MED ORDER — DULOXETINE HCL 30 MG PO CPEP: 30.0000 mg | ORAL_CAPSULE | Freq: Every day | ORAL | 3 refills | Status: DC

## 2017-11-17 NOTE — Telephone Encounter (Signed)
Please review. Thanks!  

## 2017-11-17 NOTE — Telephone Encounter (Signed)
Patients pharmacy has changed and needs 3 mths w 3 RF  worth of the following [facility]:  Propanalol 40-25 mg Amlodipine 10 mg Omeprazole Duloxetine 30mg  Terbinafine 250 mg

## 2018-10-18 ENCOUNTER — Encounter: Payer: Self-pay | Admitting: Physician Assistant

## 2018-10-18 ENCOUNTER — Telehealth: Payer: Self-pay | Admitting: *Deleted

## 2018-10-18 ENCOUNTER — Ambulatory Visit: Payer: Managed Care, Other (non HMO) | Admitting: Physician Assistant

## 2018-10-18 VITALS — BP 157/84 | HR 73 | Temp 98.2°F | Resp 16 | Ht 61.0 in | Wt 214.0 lb

## 2018-10-18 DIAGNOSIS — R079 Chest pain, unspecified: Secondary | ICD-10-CM

## 2018-10-18 DIAGNOSIS — J40 Bronchitis, not specified as acute or chronic: Secondary | ICD-10-CM

## 2018-10-18 MED ORDER — ALBUTEROL SULFATE HFA 108 (90 BASE) MCG/ACT IN AERS: 2.0000 | INHALATION_SPRAY | Freq: Four times a day (QID) | RESPIRATORY_TRACT | 2 refills | Status: DC | PRN

## 2018-10-18 MED ORDER — DOXYCYCLINE HYCLATE 100 MG PO TABS: 100.0000 mg | ORAL_TABLET | Freq: Two times a day (BID) | ORAL | 0 refills | Status: AC

## 2018-10-18 MED ORDER — PREDNISONE 20 MG PO TABS: 20.0000 mg | ORAL_TABLET | Freq: Every day | ORAL | 0 refills | Status: AC

## 2018-10-18 NOTE — Telephone Encounter (Signed)
Patient called office for an appt. Patient has had cough and congestion for 2 1/2 weeks. Patient states cough is productive. Patient has been running a fever intermittently (102.0). Patient states she starting having chest pain last night and is still having pain in chest today. No sob, no wheezing, no pain in arms or neck. Patient was seen at pain management today and her bp was 160/98. Patient was advised by pain management to see a provider. Patient was advised per Dr. Caryn Section to go to urgent care however pt stated she can not afford a visit to urgent care at this time. Scheduled pt with Carles Collet for 4:20 pm this afternoon.

## 2018-10-18 NOTE — Progress Notes (Signed)
Patient: Jean Cox Female    DOB: [birth year]   58 y.o.   MRN: [id number] Visit Date: 10/19/2018  Today's Provider: Trinna Post, PA-C   Chief Complaint  Patient presents with  . URI   Subjective:     HPI Upper Respiratory Infection: Patient with 25 pack year smoking history complains of symptoms of a URI. Symptoms include congestion, cough and fever. She complains of chest tightness and wheezing. Onset of symptoms was 2 weeks ago, gradually worsening since that time. She also c/o fever 100.2 for the past 1 day .  She is drinking plenty of fluids. Evaluation to date: none. Treatment to date: cough suppressants and decongestants.    Allergies  Allergen Reactions  . Amitriptyline Hcl     Sedation  . Lexapro  [Escitalopram]     skittish and suidical  . Lyrica  [Pregabalin]     Nightmares  . Topamax  [Topiramate]     Other reaction(s): Headache  . Erythromycin Rash     Current Outpatient Medications:  .  amLODipine (NORVASC) 10 MG tablet, Take 1 tablet (10 mg total) by mouth daily., Disp: 90 tablet, Rfl: 3 .  DULoxetine (CYMBALTA) 30 MG capsule, Take 1 capsule (30 mg total) by mouth daily., Disp: 90 capsule, Rfl: 3 .  levorphanol (LEVODROMORAN) 2 MG tablet, Take 2 mg by mouth every 8 (eight) hours as needed for pain., Disp: , Rfl:  .  Levothyroxine Sodium 125 MCG CAPS, Take 1 tablet by mouth daily., Disp: , Rfl:  .  omeprazole (PRILOSEC) 40 MG capsule, Take 1 capsule (40 mg total) by mouth daily., Disp: 90 capsule, Rfl: 4 .  OxyCODONE HCl, Abuse Deter, (OXAYDO) 5 MG TABA, TAKE 1 TABLET BY MOUTH 2 TIMES DAILY AS NEEDED, Disp: , Rfl:  .  propranolol-hydrochlorothiazide (INDERIDE) 40-25 MG tablet, TAKE 1 TABLET BY MOUTH DAILY (NEED TO SCHEDULE OFFICE VISIT FOR FOLLOW UP), Disp: 90 tablet, Rfl: 2 .  terbinafine (LAMISIL) 250 MG tablet, Take 2 tablet daily for 7 days every 4 weeks (1 week a month), Disp: 84 tablet, Rfl: 1 .  tiZANidine (ZANAFLEX) 2 MG tablet, Take 1  tablet by mouth 2 (two) times daily as needed., Disp: , Rfl: 0 .  albuterol (PROVENTIL HFA;VENTOLIN HFA) 108 (90 Base) MCG/ACT inhaler, Inhale 2 puffs into the lungs every 6 (six) hours as needed for wheezing or shortness of breath., Disp: 1 Inhaler, Rfl: 2 .  Aspirin-Acetaminophen-Caffeine (EXCEDRIN PO), Take by mouth as needed., Disp: , Rfl:  .  doxycycline (VIBRA-TABS) 100 MG tablet, Take 1 tablet (100 mg total) by mouth 2 (two) times daily for 7 days., Disp: 14 tablet, Rfl: 0 .  metFORMIN (GLUCOPHAGE-XR) 500 MG 24 hr tablet, TAKE 1 TABLET BY MOUTH EVERY DAY WITH breakfast (Patient not taking: Reported on 10/18/2018), Disp: 30 tablet, Rfl: 5 .  mupirocin ointment (BACTROBAN) 2 %, APPLY 1 APPLICATION INTO THE NOSE TWICE DAILY AS DIRECTED (Patient not taking: Reported on 07/27/2017), Disp: 66 g, Rfl: 4 .  naproxen (NAPROSYN) 500 MG tablet, Take 1 tablet (500 mg total) by mouth 2 (two) times daily with a meal. (Patient not taking: Reported on 10/18/2018), Disp: 14 tablet, Rfl: 0 .  NUCYNTA ER 50 MG 12 hr tablet, Take 50 mg by mouth 4 (four) times daily., Disp: , Rfl:  .  OxyCODONE HCl, Abuse Deter, (OXAYDO) 5 MG TABA, Take 5 tablets by mouth 2 (two) times daily as needed., Disp: , Rfl:  .  predniSONE (DELTASONE) 20 MG tablet, Take 1 tablet (20 mg total) by mouth daily with breakfast for 5 days., Disp: 5 tablet, Rfl: 0 .  tapentadol (NUCYNTA ER) 100 MG 12 hr tablet, Take 50 mg by mouth 2 (two) times daily., Disp: , Rfl:   Review of Systems  Constitutional: Positive for fatigue and fever.  HENT: Positive for congestion.   Respiratory: Positive for cough.     Social History   Tobacco Use  . Smoking status: Former Smoker    Packs/day: 1.00    Years: 25.00    Pack years: 25.00    Types: Cigarettes    Last attempt to quit: 08/24/2003    Years since quitting: 15.1  . Smokeless tobacco: Never Used  Substance Use Topics  . Alcohol use: No    Alcohol/week: 0.0 standard drinks      Objective:    BP (!) 157/84 (BP Location: Left Arm, Patient Position: Sitting, Cuff Size: Large)   Pulse 73   Temp 98.2 F (36.8 C) (Oral)   Resp 16   Ht 5\' 1"  (1.549 m)   Wt 214 lb (97.1 kg)   LMP  (LMP Unknown)   SpO2 95%   BMI 40.43 kg/m  Vitals:   10/18/18 1628  BP: (!) 157/84  Pulse: 73  Resp: 16  Temp: 98.2 F (36.8 C)  TempSrc: Oral  SpO2: 95%  Weight: 214 lb (97.1 kg)  Height: 5\' 1"  (1.549 m)     Physical Exam Constitutional:      Appearance: Normal appearance. She is ill-appearing and diaphoretic.  Cardiovascular:     Rate and Rhythm: Normal rate and regular rhythm.     Heart sounds: Normal heart sounds.  Pulmonary:     Effort: Pulmonary effort is normal.     Breath sounds: Decreased air movement present. Wheezing and rhonchi present.  Skin:    General: Skin is warm.  Neurological:     Mental Status: She is alert and oriented to person, place, and time. Mental status is at baseline.  Psychiatric:        Mood and Affect: Mood normal.        Behavior: Behavior normal.         Assessment & Plan    1. Bronchitis  Possible component of COPD, treat as below. Patient declines in office duoneb.   - doxycycline (VIBRA-TABS) 100 MG tablet; Take 1 tablet (100 mg total) by mouth 2 (two) times daily for 7 days.  Dispense: 14 tablet; Refill: 0 - predniSONE (DELTASONE) 20 MG tablet; Take 1 tablet (20 mg total) by mouth daily with breakfast for 5 days.  Dispense: 5 tablet; Refill: 0 - albuterol (PROVENTIL HFA;VENTOLIN HFA) 108 (90 Base) MCG/ACT inhaler; Inhale 2 puffs into the lungs every 6 (six) hours as needed for wheezing or shortness of breath.  Dispense: 1 Inhaler; Refill: 2  2. Chest pain, unspecified type  EKG normal.   - EKG 12-Lead  The entirety of the information documented in the History of Present Illness, Review of Systems and Physical Exam were personally obtained by me. Portions of this information were initially documented by Lynford Humphrey, CMA and reviewed by  me for thoroughness and accuracy.   Return if symptoms worsen or fail to improve.     Trinna Post, PA-C  [facility]

## 2018-10-18 NOTE — Patient Instructions (Signed)
Acute Bronchitis, Adult Acute bronchitis is when air tubes (bronchi) in the lungs suddenly get swollen. The condition can make it hard to breathe. It can also cause these symptoms:  A cough.  Coughing up clear, yellow, or green mucus.  Wheezing.  Chest congestion.  Shortness of breath.  A fever.  Body aches.  Chills.  A sore throat. Follow these instructions at home:  Medicines  Take over-the-counter and prescription medicines only as told by your doctor.  If you were prescribed an antibiotic medicine, take it as told by your doctor. Do not stop taking the antibiotic even if you start to feel better. General instructions  Rest.  Drink enough fluids to keep your pee (urine) pale yellow.  Avoid smoking and secondhand smoke. If you smoke and you need help quitting, ask your doctor. Quitting will help your lungs heal faster.  Use an inhaler, cool mist vaporizer, or humidifier as told by your doctor.  Keep all follow-up visits as told by your doctor. This is important. How is this prevented? To lower your risk of getting this condition again:  Wash your hands often with soap and water. If you cannot use soap and water, use hand sanitizer.  Avoid contact with people who have cold symptoms.  Try not to touch your hands to your mouth, nose, or eyes.  Make sure to get the flu shot every year. Contact a doctor if:  Your symptoms do not get better in 2 weeks. Get help right away if:  You cough up blood.  You have chest pain.  You have very bad shortness of breath.  You become dehydrated.  You faint (pass out) or keep feeling like you are going to pass out.  You keep throwing up (vomiting).  You have a very bad headache.  Your fever or chills gets worse. This information is not intended to replace advice given to you by your health care provider. Make sure you discuss any questions you have with your health care provider. Document Released: 01/26/2008 Document  Revised: 03/23/2017 Document Reviewed: 01/28/2016 Elsevier Interactive Patient Education  2019 Elsevier Inc.  

## 2018-10-18 NOTE — Telephone Encounter (Signed)
Noted, thanks!

## 2018-10-19 ENCOUNTER — Ambulatory Visit: Payer: Managed Care, Other (non HMO) | Admitting: Family Medicine

## 2018-10-25 ENCOUNTER — Other Ambulatory Visit: Payer: Self-pay | Admitting: Family Medicine

## 2019-01-23 ENCOUNTER — Other Ambulatory Visit: Payer: Self-pay | Admitting: Family Medicine

## 2019-01-24 ENCOUNTER — Other Ambulatory Visit: Payer: Self-pay | Admitting: Family Medicine

## 2019-01-24 DIAGNOSIS — K219 Gastro-esophageal reflux disease without esophagitis: Secondary | ICD-10-CM

## 2019-06-08 ENCOUNTER — Telehealth: Payer: Self-pay | Admitting: Family Medicine

## 2019-06-08 MED ORDER — AMOXICILLIN-POT CLAVULANATE 875-125 MG PO TABS: 1.0000 | ORAL_TABLET | Freq: Two times a day (BID) | ORAL | 0 refills | Status: AC

## 2019-06-08 NOTE — Telephone Encounter (Signed)
Patient called and explained that her pain management provider was unable to give her the injection she was due because she had an infected tooth that needed to be treated first.  She said he told her that she would need to get it from her PCP.  I explained to her that we do not usually call in antibiotics without seeing the patient.  Unfortunately we did not have any open appointments and it was recommended she be seen by Urgent Care.  She became emotional on the phone and upon further discussion she stated that she called on Wed afternoon and said after explaining the situation she was told to call back today because Dr Caryn Section was out of the office.  She was not told that she would need to be seen.   She would like to speak to the office manager because she said if she had known that she would need to be seen she could have made an appointment that day.  Instead she has had to put off  Treatment longer and has been in pain longer.  Due to the situation I asked Dr Caryn Section if he would consider making an exception and treat her without a visit.  He agreed and sent in an antibiotic.   The patient would like to talk to Judson Roch because as she said, " you can't fix a problem if you don't know there is one", reffering to not getting the proper information on her first call.

## 2019-09-19 ENCOUNTER — Telehealth: Payer: Self-pay | Admitting: Family Medicine

## 2019-09-19 MED ORDER — PROPRANOLOL HCL ER 60 MG PO CP24: 60.0000 mg | ORAL_CAPSULE | Freq: Every day | ORAL | 3 refills | Status: DC

## 2019-09-19 MED ORDER — HYDROCHLOROTHIAZIDE 25 MG PO TABS: 25.0000 mg | ORAL_TABLET | Freq: Every day | ORAL | 3 refills | Status: DC

## 2019-09-19 NOTE — Telephone Encounter (Signed)
Pt states the pharmacy advised they can no longer provide the propranolol-hydrochlorothiazide (INDERIDE) 40-25 MG tablet  They need the dr to change the medication. Or prescribe in 2 separate pills.  Pt prefers to stay on this med, so 2 separates meds ok with her.  [place], [place] Phone:  [phone]  Fax:  [phone]

## 2019-11-05 ENCOUNTER — Telehealth: Payer: Self-pay

## 2019-11-05 NOTE — Telephone Encounter (Signed)
Copied from [place] [id number]. Topic: General - Other >> Nov 05, 2019  9:24 AM Greggory Keen D wrote: Reason for CRM: Pt called to let Dr. Caryn Section know she was diagnosed with Covid around a week ago.  She is still not feeling well.  She is already on pain meds.  She does not need a call back but just wants Dr. Caryn Section to know.

## 2019-12-12 ENCOUNTER — Telehealth: Payer: Self-pay

## 2019-12-12 NOTE — Telephone Encounter (Signed)
Copied from [place] [id number]. Topic: Appointment Scheduling - Scheduling Inquiry for Clinic >> Dec 12, 2019  9:51 AM Sheran Luz wrote: Patient requesting Dr. Caryn Section to work her in next Thursday to discuss medications. Patient recently lost her husband and would like to discuss that as well. Patient will be off that entire day but there is no availability for me to schedule. Patient would only like to see Dr. Caryn Section.

## 2019-12-12 NOTE — Telephone Encounter (Signed)
Patient advised that Dr. Caryn Section is not in the office on Thursdays. Patient will call back to schedule an appointment after she looks at her schedule.

## 2020-02-13 ENCOUNTER — Telehealth: Payer: Self-pay

## 2020-02-13 NOTE — Telephone Encounter (Signed)
Copied from [place] [id number]. Topic: Appointment Scheduling - Scheduling Inquiry for Clinic >> Feb 13, 2020  3:49 PM Alease Frame wrote: Reason for CRM: Pt has a virtual appt set up for June 28th at 11:20 . Pt answered yes to having covid back in March . Wanted to know if she could change the appt to office visit instead . However will take the virtual if no availability . Please reach out to pt on number listed in contacts

## 2020-02-18 ENCOUNTER — Telehealth: Payer: Self-pay | Admitting: Family Medicine

## 2020-02-18 ENCOUNTER — Ambulatory Visit: Payer: Managed Care, Other (non HMO) | Admitting: Family Medicine

## 2020-02-18 ENCOUNTER — Encounter: Payer: Self-pay | Admitting: Family Medicine

## 2020-02-18 ENCOUNTER — Other Ambulatory Visit: Payer: Self-pay

## 2020-02-18 VITALS — BP 138/80 | HR 63 | Temp 96.8°F | Resp 16 | Ht 62.0 in | Wt 214.0 lb

## 2020-02-18 DIAGNOSIS — R21 Rash and other nonspecific skin eruption: Secondary | ICD-10-CM | POA: Diagnosis not present

## 2020-02-18 DIAGNOSIS — F32A Depression, unspecified: Secondary | ICD-10-CM

## 2020-02-18 DIAGNOSIS — Z23 Encounter for immunization: Secondary | ICD-10-CM

## 2020-02-18 DIAGNOSIS — F329 Major depressive disorder, single episode, unspecified: Secondary | ICD-10-CM | POA: Diagnosis not present

## 2020-02-18 DIAGNOSIS — F4321 Adjustment disorder with depressed mood: Secondary | ICD-10-CM

## 2020-02-18 DIAGNOSIS — E119 Type 2 diabetes mellitus without complications: Secondary | ICD-10-CM

## 2020-02-18 DIAGNOSIS — E039 Hypothyroidism, unspecified: Secondary | ICD-10-CM

## 2020-02-18 DIAGNOSIS — Z1211 Encounter for screening for malignant neoplasm of colon: Secondary | ICD-10-CM

## 2020-02-18 DIAGNOSIS — Z1159 Encounter for screening for other viral diseases: Secondary | ICD-10-CM

## 2020-02-18 DIAGNOSIS — Z114 Encounter for screening for human immunodeficiency virus [HIV]: Secondary | ICD-10-CM

## 2020-02-18 DIAGNOSIS — L309 Dermatitis, unspecified: Secondary | ICD-10-CM

## 2020-02-18 DIAGNOSIS — E785 Hyperlipidemia, unspecified: Secondary | ICD-10-CM

## 2020-02-18 LAB — POCT GLYCOSYLATED HEMOGLOBIN (HGB A1C)
Est. average glucose Bld gHb Est-mCnc: 246
Hemoglobin A1C: 10.2 % — AB (ref 4.0–5.6)

## 2020-02-18 LAB — POCT UA - MICROALBUMIN: Microalbumin Ur, POC: 20 mg/L

## 2020-02-18 MED ORDER — BUPROPION HCL ER (XL) 150 MG PO TB24: 150.0000 mg | ORAL_TABLET | Freq: Every day | ORAL | 1 refills | Status: DC

## 2020-02-18 MED ORDER — MUPIROCIN 2 % EX OINT: TOPICAL_OINTMENT | CUTANEOUS | 4 refills | Status: AC

## 2020-02-18 MED ORDER — METFORMIN HCL ER 500 MG PO TB24: ORAL_TABLET | ORAL | 3 refills | Status: DC

## 2020-02-18 MED ORDER — VALACYCLOVIR HCL 1 G PO TABS: 1000.0000 mg | ORAL_TABLET | Freq: Three times a day (TID) | ORAL | 0 refills | Status: AC

## 2020-02-18 NOTE — Addendum Note (Signed)
Addended by: Birdie Sons on: 02/18/2020 01:26 PM   Modules accepted: Orders

## 2020-02-18 NOTE — Progress Notes (Signed)
I,Roshena L Chambers,acting as a scribe for Lelon Huh, MD.,have documented all relevant documentation on the behalf of Lelon Huh, MD,as directed by  Lelon Huh, MD while in the presence of Lelon Huh, MD.  Established patient visit   Patient: Jean Cox   DOB: 12/30/1960   59 y.o. Female  MRN: [id number] Visit Date: 02/18/2020  Today's healthcare provider: Lelon Huh, MD   Chief Complaint  Patient presents with  . Diabetes  . Hypertension  . Anxiety   Subjective    HPI Diabetes Mellitus Type II, Follow-up  Lab Results  Component Value Date   HGBA1C 12.6 (H) 04/20/2017   HGBA1C 7.5 (H) 03/23/2016   HGBA1C 6.8 (A) 12/26/2014   Wt Readings from Last 3 Encounters:  02/18/20 214 lb (97.1 kg)  10/18/18 214 lb (97.1 kg)  07/27/17 222 lb (100.7 kg)   Last seen for diabetes 3 years ago. She had been followed by Dr. Gabriel Carina.  Management since then includes advising patient to take metformin once a day for a week, then increase to BID. She was also referred to [facility] for diabetes. She reports good compliance with treatment. Patient took the medication, but didn't follow up in the office for refills. She is not having side effects.  Symptoms: No fatigue No foot ulcerations  No appetite changes No nausea  No paresthesia of the feet  No polydipsia  No polyuria No visual disturbances   No vomiting     Home blood sugar records: blood sugars are not checked  Episodes of hypoglycemia? No    Current insulin regiment: none Most Recent Eye Exam: 01/2020 Current exercise: none Current diet habits: in general, an "unhealthy" diet  Pertinent Labs: Lab Results  Component Value Date   CHOL 203 (H) 07/05/2016   HDL 54 07/05/2016   LDLCALC 116 (H) 07/05/2016   TRIG 167 (H) 07/05/2016   CHOLHDL 3.8 07/05/2016   Lab Results  Component Value Date   NA 133 (L) 04/20/2017   K 3.7 04/20/2017   CREATININE 0.68 04/20/2017   GFRNONAA 100 04/20/2017    GFRAA 115 04/20/2017   GLUCOSE 396 (H) 04/20/2017     ---------------------------------------------------------------------------------------------------  Hypertension, follow-up  BP Readings from Last 3 Encounters:  02/18/20 138/80  10/18/18 (!) 157/84  07/27/17 126/76   Wt Readings from Last 3 Encounters:  02/18/20 214 lb (97.1 kg)  10/18/18 214 lb (97.1 kg)  07/27/17 222 lb (100.7 kg)     She was last seen for hypertension 3 years ago.  BP at that visit was 150/104. Management since that visit includes no changes.  She reports good compliance with treatment. She is not having side effects.  She is following a Regular diet. She is not exercising. She does not smoke.  Use of agents associated with hypertension: thyroid hormones.   Outside blood pressures are not checked. Symptoms: No chest pain No chest pressure  Yes palpitations (during Anxiety) No syncope  No dyspnea No orthopnea  No paroxysmal nocturnal dyspnea Yes lower extremity edema   Pertinent labs: Lab Results  Component Value Date   CHOL 203 (H) 07/05/2016   HDL 54 07/05/2016   LDLCALC 116 (H) 07/05/2016   TRIG 167 (H) 07/05/2016   CHOLHDL 3.8 07/05/2016   Lab Results  Component Value Date   NA 133 (L) 04/20/2017   K 3.7 04/20/2017   CREATININE 0.68 04/20/2017   GFRNONAA 100 04/20/2017   GFRAA 115 04/20/2017   GLUCOSE 396 (H) 04/20/2017  The ASCVD Risk score Mikey Bussing DC Jr., et al., 2013) failed to calculate for the following reasons:   Cannot find a previous HDL lab   Cannot find a previous total cholesterol lab   ---------------------------------------------------------------------------------------------------  Anxiety, Follow-up  She was last seen for anxiety 3 years ago. Changes made at last visit include starting Buspar.   She reports poor compliance with treatment. Patient doesn't remember being told to try Buspar. Current treatment includes Cymbalta 30mg  daily She reports good  tolerance of treatment. She is not having side effects.   She feels her anxiety is severe and Worse since last visit. She has had nore anxiety attacks since her husband passed away this year. She states she is not able to increase Cymbalta to 60mg  because she has side effects.   Symptoms:  No chest pain Yes difficulty concentrating  No dizziness No fatigue  Yes feelings of losing control Yes insomnia  Yes irritable Yes palpitations  Yes panic attacks No racing thoughts  No shortness of breath Yes sweating  No tremors/shakes    GAD-7 Results No flowsheet data found.  Depression screen [facility] 2/9 02/18/2020 05/25/2017  Decreased Interest 2 0  Down, Depressed, Hopeless 2 0  PHQ - 2 Score 4 0  Altered sleeping 3 -  Tired, decreased energy 2 -  Change in appetite 2 -  Feeling bad or failure about yourself  2 -  Trouble concentrating 2 -  Moving slowly or fidgety/restless 0 -  Suicidal thoughts 2 -  PHQ-9 Score 17 -  Difficult doing work/chores Very difficult -     She has been much more depressed due to death of her husband her had rapid decline she he was diagnosed with Covid and subsequently found to have advance metastatic cancer. She has also had issues with childhood trauma resurfacing and really wants to get in with therapist.  --------------------------------------------------------------------------------------------------- She also has very itchy rash with scattered papules left lower abdomen for the last week or so. It doesn't burn except from scratching.      Medications: Outpatient Medications Prior to Visit  Medication Sig  . amLODipine (NORVASC) 10 MG tablet TAKE 1 TABLET DAILY  . Aspirin-Acetaminophen-Caffeine (EXCEDRIN PO) Take by mouth as needed.  . DULoxetine (CYMBALTA) 30 MG capsule TAKE 1 CAPSULE DAILY  . hydrochlorothiazide (HYDRODIURIL) 25 MG tablet Take 1 tablet (25 mg total) by mouth daily. TAKE IN PLACE OF INDERIDE  . levorphanol (LEVODROMORAN) 2 MG tablet  Take 2 mg by mouth every 8 (eight) hours as needed for pain.  Marland Kitchen levothyroxine (SYNTHROID) 150 MCG tablet Take 1 tablet by mouth daily.  . naproxen (NAPROSYN) 500 MG tablet Take 1 tablet (500 mg total) by mouth 2 (two) times daily with a meal.  . omeprazole (PRILOSEC) 40 MG capsule TAKE 1 CAPSULE DAILY  . OxyCODONE HCl, Abuse Deter, (OXAYDO) 5 MG TABA TAKE 1 TABLET BY MOUTH 2 TIMES DAILY AS NEEDED  . propranolol ER (INDERAL LA) 60 MG 24 hr capsule Take 1 capsule (60 mg total) by mouth daily. TAKE IN PLACE OF INDERIDE  . tiZANidine (ZANAFLEX) 2 MG tablet Take 1 tablet by mouth 2 (two) times daily as needed.  . metFORMIN (GLUCOPHAGE-XR) 500 MG 24 hr tablet TAKE 1 TABLET BY MOUTH EVERY DAY WITH breakfast (Patient not taking: Reported on 02/18/2020)  . mupirocin ointment (BACTROBAN) 2 % APPLY 1 APPLICATION INTO THE NOSE TWICE DAILY AS DIRECTED (Patient not taking: Reported on 02/18/2020)  . terbinafine (LAMISIL) 250 MG tablet Take 2  tablet daily for 7 days every 4 weeks (1 week a month) (Patient not taking: Reported on 02/18/2020)  . [DISCONTINUED] albuterol (PROVENTIL HFA;VENTOLIN HFA) 108 (90 Base) MCG/ACT inhaler Inhale 2 puffs into the lungs every 6 (six) hours as needed for wheezing or shortness of breath. (Patient not taking: Reported on 02/18/2020)  . [DISCONTINUED] Levothyroxine Sodium 125 MCG CAPS Take 1 tablet by mouth daily. (Patient not taking: Reported on 02/18/2020)  . [DISCONTINUED] NUCYNTA ER 50 MG 12 hr tablet Take 50 mg by mouth 4 (four) times daily. (Patient not taking: Reported on 02/18/2020)  . [DISCONTINUED] OxyCODONE HCl, Abuse Deter, (OXAYDO) 5 MG TABA Take 5 tablets by mouth 2 (two) times daily as needed.  . [DISCONTINUED] tapentadol (NUCYNTA ER) 100 MG 12 hr tablet Take 50 mg by mouth 2 (two) times daily.   No facility-administered medications prior to visit.    Review of Systems  Constitutional: Negative for appetite change, chills, fatigue and fever.  Respiratory: Negative for  chest tightness and shortness of breath.   Cardiovascular: Negative for chest pain and palpitations.  Gastrointestinal: Negative for abdominal pain, nausea and vomiting.  Neurological: Negative for dizziness and weakness.  Psychiatric/Behavioral: Positive for dysphoric mood and sleep disturbance. The patient is nervous/anxious.        Frequent crying      Objective    BP 138/80 (BP Location: Left Arm, Patient Position: Sitting, Cuff Size: Large)   Pulse 63   Temp (!) 96.8 F (36 C) (Temporal)   Resp 16   Ht 5\' 2"  (1.575 m)   Wt 214 lb (97.1 kg)   LMP  (LMP Unknown)   SpO2 98% Comment: room air  BMI 39.14 kg/m    Physical Exam   General: Appearance:    Obese female in no acute distress  Eyes:    PERRL, conjunctiva/corneas clear, EOM's intact       Lungs:     Clear to auscultation bilaterally, respirations unlabored  Heart:    Normal heart rate. Normal rhythm. No murmurs, rubs, or gallops.   MS:   All extremities are intact.   Neurologic:   Awake, alert, oriented x 3. No apparent focal neurological           defect.   Skin:   Patches or tiny papules versus vesicles on erythematous base on left lower abdomen as per HPI.      Results for orders placed or performed in visit on 02/18/20  POCT UA - Microalbumin  Result Value Ref Range   Microalbumin Ur, POC 20 mg/L  POCT glycosylated hemoglobin (Hb A1C)  Result Value Ref Range   Hemoglobin A1C 10.2 (A) 4.0 - 5.6 %   Est. average glucose Bld gHb Est-mCnc 246     Assessment & Plan     1. Type 2 diabetes mellitus without complication, unspecified whether long term insulin use (HCC) She had no adverse effects from metformin, but was never refilled. Will restart medication. She is to return when she is fasting for labs.   - CBC - Comprehensive metabolic panel - Lipid panel - Pneumococcal polysaccharide vaccine 23-valent greater than or equal to 2yo subcutaneous/IM - metFORMIN (GLUCOPHAGE-XR) 500 MG 24 hr tablet; TAKE 1  TABLET BY MOUTH EVERY DAY WITH breakfast  Dispense: 90 tablet; Refill: 3  2. Depression, unspecified depression type Has done well with duloxetine for anxiety, but she does not tolerate higher doses. Will add - buPROPion (WELLBUTRIN XL) 150 MG 24 hr tablet; Take 1 tablet (150  mg total) by mouth daily.  Dispense: 30 tablet; Refill: 1  - Ambulatory referral to Psychology  3. Grief reaction  - Ambulatory referral to Psychology  4. Rash Not painful and not burning, but does have appearance similar to Zoster. Will cover with - valACYclovir (VALTREX) 1000 MG tablet; Take 1 tablet (1,000 mg total) by mouth every 8 (eight) hours for 7 days.  Dispense: 21 tablet; Refill: 0  5. Colon cancer screening  - Cologuard  6. Need for hepatitis C screening test  - Hepatitis C antibody  7. Encounter for screening for HIV  - HIV Antibody (routine testing w rflx)  8. Need for tetanus, diphtheria, and acellular pertussis (Tdap) vaccine in patient of adolescent age or older  - Tdap vaccine greater than or equal to 7yo IM  9. Need for pneumococcal vaccination  - Pneumococcal polysaccharide vaccine 23-valent greater than or equal to 2yo subcutaneous/IM  10. Hypothyroidism, unspecified type  - TSH  11. Hyperlipidemia, unspecified hyperlipidemia type Check lipids as above.   12. Dermatitis refill- mupirocin ointment (BACTROBAN) 2 %; APPLY 1 APPLICATION INTO THE NOSE TWICE DAILY AS DIRECTED  Dispense: 66 g; Refill: 4  13. Need for shingles vaccine  - Zoster, Recombinant (Shingrix)  Addressed extensive list of chronic and acute medical problems today requiring 45 minutes reviewing her medical record, counseling patient regarding her conditions and coordination of care.       No follow-ups on file.         Lelon Huh, MD  [facility] [phone] (phone) [phone] (fax)  [place]

## 2020-02-18 NOTE — Patient Instructions (Addendum)
   Please call the [facility] at [phone] to schedule your mammogram.  . Please go to the lab draw station in [address] on the second floor of [facility]  when you are fasting for 12 hours. Normal hours are 8:00am to 12:30pm and 1:30pm to 4:00pm Monday through Friday

## 2020-02-23 LAB — LIPID PANEL
Chol/HDL Ratio: 4.8 ratio — ABNORMAL HIGH (ref 0.0–4.4)
Cholesterol, Total: 227 mg/dL — ABNORMAL HIGH (ref 100–199)
HDL: 47 mg/dL (ref >39)
LDL Chol Calc (NIH): 154 mg/dL — ABNORMAL HIGH (ref 0–99)
Triglycerides: 142 mg/dL (ref 0–149)
VLDL Cholesterol Cal: 26 mg/dL (ref 5–40)

## 2020-02-23 LAB — COMPREHENSIVE METABOLIC PANEL
ALT: 12 IU/L (ref 0–32)
AST: 8 IU/L (ref 0–40)
Albumin/Globulin Ratio: 1.4 (ref 1.2–2.2)
Albumin: 4.2 g/dL (ref 3.8–4.9)
Alkaline Phosphatase: 136 IU/L — ABNORMAL HIGH (ref 48–121)
BUN/Creatinine Ratio: 21 (ref 9–23)
BUN: 13 mg/dL (ref 6–24)
Bilirubin Total: 0.4 mg/dL (ref 0.0–1.2)
CO2: 28 mmol/L (ref 20–29)
Calcium: 9.5 mg/dL (ref 8.7–10.2)
Chloride: 93 mmol/L — ABNORMAL LOW (ref 96–106)
Creatinine, Ser: 0.63 mg/dL (ref 0.57–1.00)
GFR calc Af Amer: 116 mL/min/{1.73_m2} (ref >59)
GFR calc non Af Amer: 101 mL/min/{1.73_m2} (ref >59)
Globulin, Total: 3.1 g/dL (ref 1.5–4.5)
Glucose: 237 mg/dL — ABNORMAL HIGH (ref 65–99)
Potassium: 3.7 mmol/L (ref 3.5–5.2)
Sodium: 138 mmol/L (ref 134–144)
Total Protein: 7.3 g/dL (ref 6.0–8.5)

## 2020-02-23 LAB — CBC
Hematocrit: 41.2 % (ref 34.0–46.6)
Hemoglobin: 13.3 g/dL (ref 11.1–15.9)
MCH: 26.6 pg (ref 26.6–33.0)
MCHC: 32.3 g/dL (ref 31.5–35.7)
MCV: 82 fL (ref 79–97)
Platelets: 409 10*3/uL (ref 150–450)
RBC: 5 x10E6/uL (ref 3.77–5.28)
RDW: 13.1 % (ref 11.7–15.4)
WBC: 13 10*3/uL — ABNORMAL HIGH (ref 3.4–10.8)

## 2020-02-23 LAB — T4, FREE: Free T4: 1.31 ng/dL (ref 0.82–1.77)

## 2020-02-23 LAB — HEPATITIS C ANTIBODY: Hep C Virus Ab: <0.1 s/co ratio (ref 0.0–0.9)

## 2020-02-23 LAB — TSH: TSH: 4.4 u[IU]/mL (ref 0.450–4.500)

## 2020-02-23 LAB — HIV ANTIBODY (ROUTINE TESTING W REFLEX): HIV Screen 4th Generation wRfx: NONREACTIVE

## 2020-02-26 ENCOUNTER — Telehealth: Payer: Self-pay

## 2020-02-26 DIAGNOSIS — E785 Hyperlipidemia, unspecified: Secondary | ICD-10-CM

## 2020-02-26 MED ORDER — ROSUVASTATIN CALCIUM 10 MG PO TABS: 10.0000 mg | ORAL_TABLET | Freq: Every day | ORAL | 0 refills | Status: DC

## 2020-02-26 NOTE — Telephone Encounter (Signed)
Patient advised and verbalized understanding. Patient agrees to start medication. Prescription sent into pharmacy.

## 2020-02-26 NOTE — Telephone Encounter (Signed)
-----   Message from Birdie Sons, MD sent at 02/23/2020  7:55 AM EDT ----- Cholesterol is much too high at 227. Needs to be under 180 due to having diabetes. Start rosuvastatin 10mg  once a day, #30, rf x 3. Rest of labs are good.  Follow up in August as scheduled.

## 2020-02-27 ENCOUNTER — Other Ambulatory Visit: Payer: Self-pay | Admitting: Family Medicine

## 2020-02-27 ENCOUNTER — Telehealth: Payer: Self-pay | Admitting: Family Medicine

## 2020-02-27 DIAGNOSIS — R21 Rash and other nonspecific skin eruption: Secondary | ICD-10-CM

## 2020-02-27 DIAGNOSIS — K219 Gastro-esophageal reflux disease without esophagitis: Secondary | ICD-10-CM

## 2020-02-27 NOTE — Telephone Encounter (Signed)
Requested Prescriptions  Pending Prescriptions Disp Refills   amLODipine (NORVASC) 10 MG tablet [Pharmacy Med Name: AMLODIPINE BESYLATE TABS 10MG ] 90 tablet 1    Sig: TAKE 1 TABLET DAILY     Cardiovascular:  Calcium Channel Blockers Passed - 02/27/2020  1:20 AM      Passed - Last BP in normal range    BP Readings from Last 1 Encounters:  02/18/20 138/80         Passed - Valid encounter within last 6 months    Recent Outpatient Visits          1 week ago Type 2 diabetes mellitus without complication, unspecified whether long term insulin use [facility])   [facility] Birdie Sons, MD   1 year ago [place] [facility] [place] M, [place]   2 years ago Ingrown toenail of left foot with infection   [facility] Birdie Sons, MD   2 years ago Right ear pain   [facility] Birdie Sons, MD   2 years ago [place], Donald E, MD      Future Appointments            In 1 month Fisher, Kirstie Peri, MD [facility], [facility]            DULoxetine (CYMBALTA) 30 MG capsule [Pharmacy Med Name: DULOXETINE HCL DR CAPS 30MG ] 90 capsule 3    Sig: TAKE 1 CAPSULE DAILY     Psychiatry: Antidepressants - SNRI Passed - 02/27/2020  1:20 AM      Passed - Last BP in normal range    BP Readings from Last 1 Encounters:  02/18/20 138/80         Passed - Valid encounter within last 6 months    Recent Outpatient Visits          1 week ago Type 2 diabetes mellitus without complication, unspecified whether long term insulin use ([place])   [facility] Birdie Sons, MD   1 year ago [place] [facility] [place], [place]   2 years ago Ingrown toenail of left foot with infection   [facility] Birdie Sons, MD   2 years ago Right ear pain   [facility] Birdie Sons, MD   2 years ago [place], Donald E, MD      Future Appointments            In 1 month Fisher, Kirstie Peri, MD [facility], [facility]            omeprazole (PRILOSEC) 40 MG capsule [Pharmacy Med Name: OMEPRAZOLE DR CAPS 40MG ] 90 capsule 3    Sig: TAKE 1 CAPSULE DAILY     Gastroenterology: Proton Pump Inhibitors Passed - 02/27/2020  1:20 AM      Passed - Valid encounter within last 12 months    Recent Outpatient Visits          1 week ago Type 2 diabetes mellitus without complication, unspecified whether long term insulin use [facility])   [facility] Birdie Sons, MD   1 year ago [place] [facility], [place]   2 years ago Ingrown toenail of left foot with infection   [facility] Birdie Sons, MD   2 years ago Right ear pain   [facility] Birdie Sons, MD   2 years ago [place], Donald E, MD      Future Appointments            In 1 month Fisher, Kirstie Peri, MD [facility], [place]

## 2020-02-27 NOTE — Telephone Encounter (Signed)
-----   Message from Birdie Sons, MD sent at 02/18/2020  1:21 PM EDT ----- Regarding: [place]

## 2020-02-27 NOTE — Telephone Encounter (Signed)
Patient was referred to counselor a few weeks ago and was supposed to be called by [facility], please check with patient and make sure they have contacted her about scheduling appt.

## 2020-02-29 NOTE — Telephone Encounter (Signed)
Attempted to contact patient, no answer left a voicemail. Okay for [facility] to ask patient.  

## 2020-03-03 MED ORDER — KETOCONAZOLE 2 % EX CREA: 1.0000 "application " | TOPICAL_CREAM | Freq: Every day | CUTANEOUS | 1 refills | Status: AC

## 2020-03-03 NOTE — Telephone Encounter (Signed)
Please review patient response, since rash seems to be getting worse and frequent migraines I can schedule a office visit with PA for patient to be evaluated if you feel they should return if not improving. KW

## 2020-03-03 NOTE — Telephone Encounter (Signed)
Pt returned call. States she has appt.04/04/2020 with Behavioral Health. Pt also states would like Dr. Caryn Section to know the rash on her abdomen is now blistered, severe itching and has spread "Three times the size." HAs completed course of Valtrex. Also states she forgot to let Dr. Caryn Section know at last appt she has had more frequent migraines the past 1 and 1/2 years. States "I have a migraine about 1/3 of every month." States last one Sunday. States has been on Imitrex and Topamax "Did nothing."  Please advise.

## 2020-03-03 NOTE — Telephone Encounter (Signed)
Pt called stating that she is at work. And is requesting to have a call back. Please advise.

## 2020-03-03 NOTE — Telephone Encounter (Signed)
Tried to call pt. Back; left vm to return call to office.

## 2020-03-03 NOTE — Telephone Encounter (Signed)
[facility]  For headaches she can try combination of OTC magnesium oxide 400mg  once a day, CoQ10 100mg  once a day on Riboflavin 400mg  once a day. Given it 2-3 week, if that doesn't help then we can try a prescription medication.

## 2020-03-03 NOTE — Telephone Encounter (Signed)
Attempted to call pt.  Left vm to return call to office.  

## 2020-03-04 NOTE — Telephone Encounter (Signed)
Patient advised. She verbalized understanding.  

## 2020-03-11 ENCOUNTER — Telehealth: Payer: Self-pay | Admitting: Family Medicine

## 2020-03-11 LAB — COLOGUARD: Cologuard: POSITIVE — AB

## 2020-03-11 MED ORDER — BUPROPION HCL ER (XL) 150 MG PO TB24: 150.0000 mg | ORAL_TABLET | Freq: Every day | ORAL | 1 refills | Status: DC

## 2020-03-11 NOTE — Telephone Encounter (Signed)
Pt states she is able to tolerate new medication and would like for Fisher to send over full prescription to pharmacy listed below . Please advise           Medication Refill - Medication: buPROPion (WELLBUTRIN XL) 150 MG 24 hr tablet [[id number]]     Preferred Pharmacy (with phone number or street name): [place], [place]  [address] [place] [address]  Phone: [phone] Fax: [phone]  Hours: Not open 24 hours     Agent: Please be advised that RX refills may take up to 3 business days. We ask that you follow-up with your pharmacy.

## 2020-03-14 ENCOUNTER — Telehealth: Payer: Self-pay | Admitting: Family Medicine

## 2020-03-14 DIAGNOSIS — R195 Other fecal abnormalities: Secondary | ICD-10-CM

## 2020-03-14 NOTE — Telephone Encounter (Signed)
Patina with [employer] calling to inform you of positive [place] result on the pt. She is also faxing the result now.

## 2020-03-16 NOTE — Telephone Encounter (Signed)
E placed order for gi referral for positive Cologuard, please advise patient.

## 2020-03-17 NOTE — Telephone Encounter (Signed)
Pt advised.   Thanks,   -Husam Hohn  

## 2020-03-24 ENCOUNTER — Other Ambulatory Visit: Payer: Self-pay | Admitting: Family Medicine

## 2020-03-24 ENCOUNTER — Telehealth (INDEPENDENT_AMBULATORY_CARE_PROVIDER_SITE_OTHER): Payer: Self-pay | Admitting: Gastroenterology

## 2020-03-24 ENCOUNTER — Other Ambulatory Visit: Payer: Self-pay

## 2020-03-24 DIAGNOSIS — R195 Other fecal abnormalities: Secondary | ICD-10-CM

## 2020-03-24 MED ORDER — NA SULFATE-K SULFATE-MG SULF 17.5-3.13-1.6 GM/177ML PO SOLN: 1.0000 | Freq: Once | ORAL | 0 refills | Status: AC

## 2020-03-24 MED ORDER — PROPRANOLOL HCL ER 60 MG PO CP24: 60.0000 mg | ORAL_CAPSULE | Freq: Every day | ORAL | 1 refills | Status: AC

## 2020-03-24 MED ORDER — HYDROCHLOROTHIAZIDE 25 MG PO TABS: 25.0000 mg | ORAL_TABLET | Freq: Every day | ORAL | 1 refills | Status: AC

## 2020-03-24 NOTE — Telephone Encounter (Signed)
Patient is calling for advice - Patient that the rash on her legs has spread to her arm (mostly on the right) under her arm pit. And it has spread to her toso. First available app that Dr Caryn Section has is Friday 03/28/20. Should an appt be scheduled with PA or should more medication be called in? [facility] [place], [place]. Cb- [phone]

## 2020-03-24 NOTE — Telephone Encounter (Signed)
Requested Prescriptions  Pending Prescriptions Disp Refills  . propranolol ER (INDERAL LA) 60 MG 24 hr capsule 90 capsule 1    Sig: Take 1 capsule (60 mg total) by mouth daily. TAKE IN PLACE OF INDERIDE     Cardiovascular:  Beta Blockers Passed - 03/24/2020  8:36 AM      Passed - Last BP in normal range    BP Readings from Last 1 Encounters:  02/18/20 138/80         Passed - Last Heart Rate in normal range    Pulse Readings from Last 1 Encounters:  02/18/20 63         Passed - Valid encounter within last 6 months    Recent Outpatient Visits          1 month ago Type 2 diabetes mellitus without complication, unspecified whether long term insulin use [facility])   [facility] Birdie Sons, MD   1 year ago [place] Car[place]let [place], [place]   2 years ago Ingrown toenail of left foot with infection   [facility] Birdie Sons, MD   2 years ago Right ear pain   [facility] Birdie Sons, MD   2 years ago [place], MD      Future Appointments            Today  [facility] [place]   In 4 weeks Fisher, Kirstie Peri, MD [facility], [facility]           . hydrochlorothiazide (HYDRODIURIL) 25 MG tablet 90 tablet 1    Sig: Take 1 tablet (25 mg total) by mouth daily. TAKE IN PLACE OF INDERIDE     Cardiovascular: Diuretics - Thiazide Passed - 03/24/2020  8:36 AM      Passed - Ca in normal range and within 360 days    Calcium  Date Value Ref Range Status  02/22/2020 9.5 8.7 - 10.2 mg/dL Final         Passed - Cr in normal range and within 360 days    Creatinine, Ser  Date Value Ref Range Status  02/22/2020 0.63 0.57 - 1.00 mg/dL Final         Passed - K in normal range and within 360 days    Potassium  Date Value Ref Range Status  02/22/2020 3.7 3.5 - 5.2 mmol/L Final         Passed - Na in normal range and within 360 days    Sodium  Date  Value Ref Range Status  02/22/2020 138 134 - 144 mmol/L Final         Passed - Last BP in normal range    BP Readings from Last 1 Encounters:  02/18/20 138/80         Passed - Valid encounter within last 6 months    Recent Outpatient Visits          1 month ago Type 2 diabetes mellitus without complication, unspecified whether long term insulin use [facility])   [facility] Birdie Sons, MD   1 year ago [place], [place], [place]   2 years ago Ingrown toenail of left foot with infection   [facility] Birdie Sons, MD   2 years ago Right ear pain   [facility] Birdie Sons, MD   2 years ago  Alopecia   [facility] Birdie Sons, MD      Future Appointments            Today  [place]   In 4 weeks Caryn Section, Kirstie Peri, MD [facility], [place]

## 2020-03-24 NOTE — Progress Notes (Signed)
Gastroenterology Pre-Procedure Review  Request Date: Thursday 04/17/20 Requesting Physician: Dr. Vicente Males  PATIENT REVIEW QUESTIONS: The patient responded to the following health history questions as indicated:    1. Are you having any GI issues? no 2. Do you have a personal history of Polyps? no 3. Do you have a family history of Colon Cancer or Polyps? no 4. Diabetes Mellitus? yes (type 2 diabetic takes oral meds) 5. Joint replacements in the past 12 months?no 6. Major health problems in the past 3 months?no 7. Any artificial heart valves, MVP, or defibrillator?no    MEDICATIONS & ALLERGIES:    Patient reports the following regarding taking any anticoagulation/antiplatelet therapy:   Plavix, Coumadin, Eliquis, Xarelto, Lovenox, Pradaxa, Brilinta, or Effient? no Aspirin? no  Patient confirms/reports the following medications:  Current Outpatient Medications  Medication Sig Dispense Refill   amLODipine (NORVASC) 10 MG tablet TAKE 1 TABLET DAILY 90 tablet 1   Aspirin-Acetaminophen-Caffeine (EXCEDRIN PO) Take by mouth as needed.     buPROPion (WELLBUTRIN XL) 150 MG 24 hr tablet Take 1 tablet (150 mg total) by mouth daily. 90 tablet 1   DULoxetine (CYMBALTA) 30 MG capsule TAKE 1 CAPSULE DAILY 90 capsule 1   hydrochlorothiazide (HYDRODIURIL) 25 MG tablet Take 1 tablet (25 mg total) by mouth daily. TAKE IN PLACE OF INDERIDE 90 tablet 1   ketoconazole (NIZORAL) 2 % cream Apply 1 application topically daily. 60 g 1   levorphanol (LEVODROMORAN) 2 MG tablet Take 2 mg by mouth every 8 (eight) hours as needed for pain.     levothyroxine (SYNTHROID) 150 MCG tablet Take 1 tablet by mouth daily.     metFORMIN (GLUCOPHAGE-XR) 500 MG 24 hr tablet TAKE 1 TABLET BY MOUTH EVERY DAY WITH breakfast 90 tablet 3   mupirocin ointment (BACTROBAN) 2 % APPLY 1 APPLICATION INTO THE NOSE TWICE DAILY AS DIRECTED 66 g 4   omeprazole (PRILOSEC) 40 MG capsule TAKE 1 CAPSULE DAILY 90 capsule 3   oxyCODONE  (OXY IR/ROXICODONE) 5 MG immediate release tablet Take 5 mg by mouth 2 (two) times daily as needed.     propranolol ER (INDERAL LA) 60 MG 24 hr capsule Take 1 capsule (60 mg total) by mouth daily. TAKE IN PLACE OF INDERIDE 90 capsule 1   rosuvastatin (CRESTOR) 10 MG tablet Take 1 tablet (10 mg total) by mouth daily. 90 tablet 0   tiZANidine (ZANAFLEX) 2 MG tablet Take 1 tablet by mouth 2 (two) times daily as needed.  0   Na Sulfate-K Sulfate-Mg Sulf 17.5-3.13-1.6 GM/177ML SOLN Take 1 kit by mouth once for 1 dose. 354 mL 0   naproxen (NAPROSYN) 500 MG tablet Take 1 tablet (500 mg total) by mouth 2 (two) times daily with a meal. (Patient not taking: Reported on 03/24/2020) 14 tablet 0   terbinafine (LAMISIL) 250 MG tablet Take 2 tablet daily for 7 days every 4 weeks (1 week a month) (Patient not taking: Reported on 02/18/2020) 84 tablet 1   No current facility-administered medications for this visit.    Patient confirms/reports the following allergies:  Allergies  Allergen Reactions   Amitriptyline Hcl     Sedation   Lexapro  [Escitalopram]     skittish and suidical   Lyrica  [Pregabalin]     Nightmares   Topamax  [Topiramate]     Other reaction(s): Headache   Erythromycin Rash    No orders of the defined types were placed in this encounter.   AUTHORIZATION INFORMATION Primary Insurance: 1D#: Group #:  Secondary Insurance: 1D#: Group #:  SCHEDULE INFORMATION: Date: Thursday 04/17/20 Time: Location:[facility]

## 2020-03-24 NOTE — Telephone Encounter (Signed)
Medication:propranolol ER (INDERAL LA) 60 MG 24 hr capsule [[id number]] , hydrochlorothiazide (HYDRODIURIL) 25 MG tablet [[id number]]   Has the patient contacted their pharmacy? YES (Agent: If no, request that the patient contact the pharmacy for the refill.) (Agent: If yes, when and what did the pharmacy advise?)  Preferred Pharmacy (with phone number or street name): [facility] - [address], [address] [place]  Phone:  [phone] Fax:  [phone]     Agent: Please be advised that RX refills may take up to 3 business days. We ask that you follow-up with your pharmacy.

## 2020-04-15 ENCOUNTER — Other Ambulatory Visit
Admission: RE | Admit: 2020-04-15 | Discharge: 2020-04-15 | Disposition: A | Discharge status: Home / Self Care | Payer: Managed Care, Other (non HMO) | Source: Ambulatory Visit | Attending: Gastroenterology | Admitting: Gastroenterology

## 2020-04-15 ENCOUNTER — Other Ambulatory Visit: Payer: Self-pay

## 2020-04-15 DIAGNOSIS — Z20822 Contact with and (suspected) exposure to covid-19: Secondary | ICD-10-CM | POA: Insufficient documentation

## 2020-04-15 DIAGNOSIS — Z01812 Encounter for preprocedural laboratory examination: Secondary | ICD-10-CM | POA: Insufficient documentation

## 2020-04-16 LAB — SARS CORONAVIRUS 2 (TAT 6-24 HRS): SARS Coronavirus 2: NEGATIVE

## 2020-04-17 ENCOUNTER — Encounter: Payer: Self-pay | Admitting: Gastroenterology

## 2020-04-17 ENCOUNTER — Ambulatory Visit: Payer: Managed Care, Other (non HMO) | Admitting: Anesthesiology

## 2020-04-17 ENCOUNTER — Encounter
Admission: RE | Disposition: A | Discharge status: Home / Self Care | Payer: Self-pay | Source: Home / Self Care | Attending: Gastroenterology

## 2020-04-17 ENCOUNTER — Ambulatory Visit
Admission: RE | Admit: 2020-04-17 | Discharge: 2020-04-17 | Disposition: A | Discharge status: Home / Self Care | Payer: Managed Care, Other (non HMO) | Attending: Gastroenterology | Admitting: Gastroenterology

## 2020-04-17 ENCOUNTER — Other Ambulatory Visit: Payer: Self-pay

## 2020-04-17 DIAGNOSIS — Z79890 Hormone replacement therapy (postmenopausal): Secondary | ICD-10-CM | POA: Diagnosis not present

## 2020-04-17 DIAGNOSIS — Z9049 Acquired absence of other specified parts of digestive tract: Secondary | ICD-10-CM | POA: Diagnosis not present

## 2020-04-17 DIAGNOSIS — R195 Other fecal abnormalities: Secondary | ICD-10-CM | POA: Diagnosis not present

## 2020-04-17 DIAGNOSIS — G709 Myoneural disorder, unspecified: Secondary | ICD-10-CM | POA: Diagnosis not present

## 2020-04-17 DIAGNOSIS — K219 Gastro-esophageal reflux disease without esophagitis: Secondary | ICD-10-CM | POA: Diagnosis not present

## 2020-04-17 DIAGNOSIS — Z881 Allergy status to other antibiotic agents status: Secondary | ICD-10-CM | POA: Diagnosis not present

## 2020-04-17 DIAGNOSIS — Z87891 Personal history of nicotine dependence: Secondary | ICD-10-CM | POA: Insufficient documentation

## 2020-04-17 DIAGNOSIS — Z8249 Family history of ischemic heart disease and other diseases of the circulatory system: Secondary | ICD-10-CM | POA: Diagnosis not present

## 2020-04-17 DIAGNOSIS — Z888 Allergy status to other drugs, medicaments and biological substances status: Secondary | ICD-10-CM | POA: Insufficient documentation

## 2020-04-17 DIAGNOSIS — E119 Type 2 diabetes mellitus without complications: Secondary | ICD-10-CM | POA: Insufficient documentation

## 2020-04-17 DIAGNOSIS — Z7984 Long term (current) use of oral hypoglycemic drugs: Secondary | ICD-10-CM | POA: Diagnosis not present

## 2020-04-17 DIAGNOSIS — E039 Hypothyroidism, unspecified: Secondary | ICD-10-CM | POA: Diagnosis not present

## 2020-04-17 DIAGNOSIS — F419 Anxiety disorder, unspecified: Secondary | ICD-10-CM | POA: Diagnosis not present

## 2020-04-17 DIAGNOSIS — Z79899 Other long term (current) drug therapy: Secondary | ICD-10-CM | POA: Insufficient documentation

## 2020-04-17 DIAGNOSIS — I10 Essential (primary) hypertension: Secondary | ICD-10-CM | POA: Diagnosis not present

## 2020-04-17 HISTORY — PX: COLONOSCOPY WITH PROPOFOL: SHX5780

## 2020-04-17 LAB — GLUCOSE, CAPILLARY: Glucose-Capillary: 189 mg/dL — ABNORMAL HIGH (ref 70–99)

## 2020-04-17 SURGERY — COLONOSCOPY WITH PROPOFOL
Anesthesia: General

## 2020-04-17 MED ORDER — PROPOFOL 10 MG/ML IV BOLUS
INTRAVENOUS | Status: DC | PRN
  Administered 2020-04-17: 100 mg via INTRAVENOUS

## 2020-04-17 MED ORDER — SODIUM CHLORIDE 0.9 % IV SOLN: INTRAVENOUS | Status: DC

## 2020-04-17 MED ORDER — PROPOFOL 500 MG/50ML IV EMUL
INTRAVENOUS | Status: AC
  Filled 2020-04-17: qty 50

## 2020-04-17 MED ORDER — PROPOFOL 500 MG/50ML IV EMUL
INTRAVENOUS | Status: DC | PRN
  Administered 2020-04-17: 150 ug/kg/min via INTRAVENOUS

## 2020-04-17 NOTE — Anesthesia Preprocedure Evaluation (Signed)
Anesthesia Evaluation  Patient identified by MRN, date of birth, ID band Patient awake    Reviewed: Allergy & Precautions, NPO status , Patient's Chart, lab work & pertinent test results  Airway Mallampati: III       Dental   Pulmonary former smoker,           Cardiovascular hypertension, + angina      Neuro/Psych  Headaches, PSYCHIATRIC DISORDERS Anxiety  Neuromuscular disease    GI/Hepatic GERD  ,  Endo/Other  diabetesHypothyroidism   Renal/GU   negative genitourinary   Musculoskeletal   Abdominal   Peds  Hematology negative hematology ROS (+)   Anesthesia Other Findings   Reproductive/Obstetrics                             Anesthesia Physical Anesthesia Plan  ASA: III  Anesthesia Plan: General   Post-op Pain Management:    Induction: Intravenous  PONV Risk Score and Plan: Propofol infusion  Airway Management Planned: Natural Airway and Nasal Cannula  Additional Equipment:   Intra-op Plan:   Post-operative Plan:   Informed Consent: I have reviewed the patients History and Physical, chart, labs and discussed the procedure including the risks, benefits and alternatives for the proposed anesthesia with the patient or authorized representative who has indicated his/her understanding and acceptance.     Dental advisory given  Plan Discussed with: CRNA and Surgeon  Anesthesia Plan Comments:         Anesthesia Quick Evaluation

## 2020-04-17 NOTE — Transfer of Care (Signed)
Immediate Anesthesia Transfer of Care Note  Patient: Jean Cox  Procedure(s) Performed: COLONOSCOPY WITH PROPOFOL (N/A )  Patient Location: PACU  Anesthesia Type:General  Level of Consciousness: awake and alert   Airway & Oxygen Therapy: Patient Spontanous Breathing and Patient connected to nasal cannula oxygen  Post-op Assessment: Report given to RN and Post -op Vital signs reviewed and stable  Post vital signs: Reviewed and stable  Last Vitals:  Vitals Value Taken Time  BP 94/60 04/17/20 0956  Temp    Pulse 62 04/17/20 0958  Resp 9 04/17/20 0958  SpO2 94 % 04/17/20 0958  Vitals shown include unvalidated device data.  Last Pain:  Vitals:   04/17/20 0955  TempSrc: (P) Temporal  PainSc:          Complications: No complications documented.

## 2020-04-17 NOTE — Op Note (Signed)
[facility] Gastroenterology Patient Name: Jean Cox Procedure Date: 04/17/2020 9:04 AM MRN: [id number] Account #: [id number] Date of Birth: Dec 07, 1960 Admit Type: Outpatient Age: 59 Room: [facility] ENDO ROOM 4 Gender: Female Note Status: Finalized Procedure:             Colonoscopy Indications:           Positive Cologuard test Providers:             Jonathon Bellows MD, MD Referring MD:          Kirstie Peri. Caryn Section, MD (Referring MD) Medicines:             Monitored Anesthesia Care Complications:         No immediate complications. Procedure:             Pre-Anesthesia Assessment:                        - Prior to the procedure, a History and Physical was                         performed, and patient medications, allergies and                         sensitivities were reviewed. The patient's tolerance                         of previous anesthesia was reviewed.                        - The risks and benefits of the procedure and the                         sedation options and risks were discussed with the                         patient. All questions were answered and informed                         consent was obtained.                        - ASA Grade Assessment: II - A patient with mild                         systemic disease.                        After obtaining informed consent, the colonoscope was                         passed under direct vision. Throughout the procedure,                         the patient's blood pressure, pulse, and oxygen                         saturations were monitored continuously. The                         Colonoscope was introduced through the anus and  advanced to the the cecum, identified by the                         appendiceal orifice. The colonoscopy was performed                         with ease. The patient tolerated the procedure well.                         The quality of the bowel  preparation was excellent. Findings:      The perianal and digital rectal examinations were normal.      The entire examined colon appeared normal on direct and retroflexion       views. Impression:            - The entire examined colon is normal on direct and                         retroflexion views.                        - No specimens collected. Recommendation:        - Discharge patient to home (with escort).                        - Resume previous diet.                        - Continue present medications. Procedure Code(s):     --- Professional ---                        [phone], Colonoscopy, flexible; diagnostic, including                         collection of specimen(s) by brushing or washing, when                         performed (separate procedure) Diagnosis Code(s):     --- Professional ---                        R19.5, Other fecal abnormalities CPT copyright 2019 American Medical Association. All rights reserved. The codes documented in this report are preliminary and upon coder review may  be revised to meet current compliance requirements. Jonathon Bellows, MD Jonathon Bellows MD, MD 04/17/2020 9:56:15 AM This report has been signed electronically. Number of Addenda: 0 Note Initiated On: 04/17/2020 9:04 AM Scope Withdrawal Time: 0 hours 19 minutes 3 seconds  Total Procedure Duration: 0 hours 25 minutes 2 seconds  Estimated Blood Loss:  Estimated blood loss: none.      [facility]

## 2020-04-17 NOTE — H&P (Signed)
Jonathon Bellows, MD [address], [place], [place], [place], [address] [address], [place], [place], [place], [address] Phone: [phone]  Fax: [phone]  Primary Care Physician:  Birdie Sons, MD   Pre-Procedure History & Physical: HPI:  Jean Cox is a 59 y.o. female is here for an colonoscopy.   Past Medical History:  Diagnosis Date  . Diabetes mellitus without complication ([place])   . Hx of migraine headaches   . Kidney stones   . Varicose vein of leg     Past Surgical History:  Procedure Laterality Date  . APPENDECTOMY  1976  . Laser ablation right saphenous vein Right 02/22/2014   Dr. Delana Meyer  . TONSILLECTOMY AND ADENOIDECTOMY  1972    Prior to Admission medications   Medication Sig Start Date End Date Taking? Authorizing Provider  amLODipine (NORVASC) 10 MG tablet TAKE 1 TABLET DAILY 02/27/20  Yes Birdie Sons, MD  buPROPion (WELLBUTRIN XL) 150 MG 24 hr tablet Take 1 tablet (150 mg total) by mouth daily. 03/11/20  Yes Birdie Sons, MD  DULoxetine (CYMBALTA) 30 MG capsule TAKE 1 CAPSULE DAILY 02/27/20  Yes Birdie Sons, MD  hydrochlorothiazide (HYDRODIURIL) 25 MG tablet Take 1 tablet (25 mg total) by mouth daily. TAKE IN PLACE OF INDERIDE 03/24/20  Yes Birdie Sons, MD  levothyroxine (SYNTHROID) 150 MCG tablet Take 1 tablet by mouth daily. 05/30/19  Yes [provider]  lurasidone (LATUDA) 20 MG TABS tablet Take 20 mg by mouth at bedtime.   Yes [provider]  metFORMIN (GLUCOPHAGE-XR) 500 MG 24 hr tablet TAKE 1 TABLET BY MOUTH EVERY DAY WITH breakfast 02/18/20  Yes Birdie Sons, MD  omeprazole (PRILOSEC) 40 MG capsule TAKE 1 CAPSULE DAILY 02/27/20  Yes Birdie Sons, MD  oxyCODONE (OXY IR/ROXICODONE) 5 MG immediate release tablet Take 5 mg by mouth 2 (two) times daily as needed.   Yes [provider]  propranolol ER (INDERAL LA) 60 MG 24 hr capsule Take 1 capsule (60 mg total) by mouth daily. TAKE IN PLACE OF  INDERIDE 03/24/20  Yes Birdie Sons, MD  rosuvastatin (CRESTOR) 10 MG tablet Take 1 tablet (10 mg total) by mouth daily. 02/26/20  Yes Birdie Sons, MD  Aspirin-Acetaminophen-Caffeine (EXCEDRIN PO) Take by mouth as needed.    [provider]  ketoconazole (NIZORAL) 2 % cream Apply 1 application topically daily. 03/03/20   Birdie Sons, MD  levorphanol (LEVODROMORAN) 2 MG tablet Take 2 mg by mouth every 8 (eight) hours as needed for pain.    [provider]  mupirocin ointment (BACTROBAN) 2 % APPLY 1 APPLICATION INTO THE NOSE TWICE DAILY AS DIRECTED 02/18/20   Birdie Sons, MD  naproxen (NAPROSYN) 500 MG tablet Take 1 tablet (500 mg total) by mouth 2 (two) times daily with a meal. Patient not taking: Reported on 03/24/2020 05/25/17   Birdie Sons, MD  terbinafine (LAMISIL) 250 MG tablet Take 2 tablet daily for 7 days every 4 weeks (1 week a month) Patient not taking: Reported on 02/18/2020 11/17/17   Birdie Sons, MD  tiZANidine (ZANAFLEX) 2 MG tablet Take 1 tablet by mouth 2 (two) times daily as needed. 05/15/15   [provider]    Allergies as of 03/24/2020 - Review Complete 03/24/2020  Allergen Reaction Noted  . Amitriptyline hcl  01/28/2015  . Lexapro  [escitalopram]  01/28/2015  . Lyrica  [pregabalin]  01/28/2015  . Topamax  [  topiramate]  01/28/2015  . Erythromycin Rash 01/28/2015    Family History  Problem Relation Age of Onset  . Heart attack Mother   . Cancer Mother   . CAD Father   . Hypertension Father   . Hypertension Sister   . Hypertension Sister     Social History   Socioeconomic History  . Marital status: Widowed    Spouse name: Not on file  . Number of children: Not on file  . Years of education: Not on file  . Highest education level: Not on file  Occupational History  . Not on file  Tobacco Use  . Smoking status: Former Smoker    Packs/day: 1.00    Years: 25.00    Pack years: 25.00    Types: Cigarettes    Quit  date: 08/24/2003    Years since quitting: 16.6  . Smokeless tobacco: Never Used  Substance and Sexual Activity  . Alcohol use: No    Alcohol/week: 0.0 standard drinks  . Drug use: No  . Sexual activity: Not on file  Other Topics Concern  . Not on file  Social History Narrative  . Not on file   Social Determinants of Health   Financial Resource Strain:   . Difficulty of Paying Living Expenses: Not on file  Food Insecurity:   . Worried About Charity fundraiser in the Last Year: Not on file  . Ran Out of Food in the Last Year: Not on file  Transportation Needs:   . Lack of Transportation (Medical): Not on file  . Lack of Transportation (Non-Medical): Not on file  Physical Activity:   . Days of Exercise per Week: Not on file  . Minutes of Exercise per Session: Not on file  Stress:   . Feeling of Stress : Not on file  Social Connections:   . Frequency of Communication with Friends and Family: Not on file  . Frequency of Social Gatherings with Friends and Family: Not on file  . Attends Religious Services: Not on file  . Active Member of Clubs or Organizations: Not on file  . Attends Archivist Meetings: Not on file  . Marital Status: Not on file  Intimate Partner Violence:   . Fear of Current or Ex-Partner: Not on file  . Emotionally Abused: Not on file  . Physically Abused: Not on file  . Sexually Abused: Not on file    Review of Systems: See HPI, otherwise negative ROS  Physical Exam: BP 137/87   Pulse 69   Temp 98.6 F (37 C) (Temporal)   Resp 18   Ht 5\' 2"  (1.575 m)   Wt 93.9 kg   LMP  (LMP Unknown)   SpO2 96%   BMI 37.86 kg/m  General:   Alert,  pleasant and cooperative in NAD Head:  Normocephalic and atraumatic. Neck:  Supple; no masses or thyromegaly. Lungs:  Clear throughout to auscultation, normal respiratory effort.    Heart:  +S1, +S2, Regular rate and rhythm, No edema. Abdomen:  Soft, nontender and nondistended. Normal bowel sounds, without  guarding, and without rebound.   Neurologic:  Alert and  oriented x4;  grossly normal neurologically.  Impression/Plan: Jean Cox is here for an colonoscopy to be performed for Screening colonoscopy with positive cologuard  Risks, benefits, limitations, and alternatives regarding  colonoscopy have been reviewed with the patient.  Questions have been answered.  All parties agreeable.   Jonathon Bellows, MD  04/17/2020, 9:00 AM

## 2020-04-18 ENCOUNTER — Encounter: Payer: Self-pay | Admitting: Gastroenterology

## 2020-04-18 NOTE — Anesthesia Postprocedure Evaluation (Signed)
Anesthesia Post Note  Patient: Jean Cox  Procedure(s) Performed: COLONOSCOPY WITH PROPOFOL (N/A )  Patient location during evaluation: Endoscopy Anesthesia Type: General Level of consciousness: awake and alert and oriented Pain management: pain level controlled Vital Signs Assessment: post-procedure vital signs reviewed and stable Respiratory status: spontaneous breathing Cardiovascular status: blood pressure returned to baseline Anesthetic complications: no   No complications documented.   Last Vitals:  Vitals:   04/17/20 1005 04/17/20 1015  BP: 111/73 111/73  Pulse: 67 64  Resp: 11 10  Temp:    SpO2: 96% 95%    Last Pain:  Vitals:   04/18/20 0741  TempSrc:   PainSc: 0-No pain                 Kashira Behunin

## 2020-04-21 ENCOUNTER — Other Ambulatory Visit: Payer: Self-pay

## 2020-04-21 ENCOUNTER — Encounter: Payer: Self-pay | Admitting: Family Medicine

## 2020-04-21 ENCOUNTER — Ambulatory Visit (INDEPENDENT_AMBULATORY_CARE_PROVIDER_SITE_OTHER): Payer: Managed Care, Other (non HMO) | Admitting: Family Medicine

## 2020-04-21 VITALS — BP 116/75 | HR 72 | Temp 98.3°F | Resp 18 | Wt 213.0 lb

## 2020-04-21 DIAGNOSIS — E119 Type 2 diabetes mellitus without complications: Secondary | ICD-10-CM | POA: Diagnosis not present

## 2020-04-21 DIAGNOSIS — G43809 Other migraine, not intractable, without status migrainosus: Secondary | ICD-10-CM

## 2020-04-21 DIAGNOSIS — F419 Anxiety disorder, unspecified: Secondary | ICD-10-CM | POA: Diagnosis not present

## 2020-04-21 DIAGNOSIS — E782 Mixed hyperlipidemia: Secondary | ICD-10-CM

## 2020-04-21 DIAGNOSIS — Z23 Encounter for immunization: Secondary | ICD-10-CM

## 2020-04-21 LAB — POCT GLYCOSYLATED HEMOGLOBIN (HGB A1C)
Est. average glucose Bld gHb Est-mCnc: 226
Hemoglobin A1C: 9.5 % — AB (ref 4.0–5.6)

## 2020-04-21 MED ORDER — DICLOFENAC SODIUM 50 MG PO TBEC: 50.0000 mg | DELAYED_RELEASE_TABLET | Freq: Two times a day (BID) | ORAL | 1 refills | Status: AC | PRN

## 2020-04-21 MED ORDER — METFORMIN HCL ER 500 MG PO TB24: 1000.0000 mg | ORAL_TABLET | Freq: Every day | ORAL | 2 refills | Status: DC

## 2020-04-21 MED ORDER — METFORMIN HCL ER 500 MG PO TB24: 1000.0000 mg | ORAL_TABLET | Freq: Every day | ORAL | 1 refills | Status: DC

## 2020-04-21 NOTE — Progress Notes (Signed)
I,Roshena L Chambers,acting as a scribe for Lelon Huh, MD.,have documented all relevant documentation on the behalf of Lelon Huh, MD,as directed by  Lelon Huh, MD while in the presence of Lelon Huh, MD.  Established patient visit   Patient: Jean Cox   DOB: 1961-03-17   59 y.o. Female  MRN: [id number] Visit Date: 04/21/2020  Today's healthcare provider: Lelon Huh, MD   Chief Complaint  Patient presents with  . Diabetes  . Anxiety   Subjective    HPI  Diabetes Mellitus Type II, Follow-up  Lab Results  Component Value Date   HGBA1C 9.5 (A) 04/21/2020   HGBA1C 10.2 (A) 02/18/2020   HGBA1C 12.6 (H) 04/20/2017   Wt Readings from Last 3 Encounters:  04/21/20 213 lb (96.6 kg)  04/17/20 207 lb (93.9 kg)  02/18/20 214 lb (97.1 kg)   Last seen for diabetes 2 months ago.  Management since then includes restarted Metformin. She reports good compliance with treatment. She is not having side effects.  Symptoms: Yes fatigue No foot ulcerations  No appetite changes No nausea  No paresthesia of the feet  No polydipsia  No polyuria No visual disturbances   No vomiting     Home blood sugar records: blood sugars are not checked  Episodes of hypoglycemia? No    Current insulin regiment: none Most Recent Eye Exam: <1 year ago Current exercise: none Current diet habits: in general, an "unhealthy" diet  Pertinent Labs: Lab Results  Component Value Date   CHOL 227 (H) 02/22/2020   HDL 47 02/22/2020   LDLCALC 154 (H) 02/22/2020   TRIG 142 02/22/2020   CHOLHDL 4.8 (H) 02/22/2020   Lab Results  Component Value Date   NA 138 02/22/2020   K 3.7 02/22/2020   CREATININE 0.63 02/22/2020   GFRNONAA 101 02/22/2020   GFRAA 116 02/22/2020   GLUCOSE 237 (H) 02/22/2020     ---------------------------------------------------------------------------------------------------  Anxiety, Follow-up  She was last seen for anxiety 2 months ago. Changes made at  last visit include adding BuPROPion (WELLBUTRIN XL) 150 MG 24 hr tablet daily. Patient was also referred to psychology. Anette Guarneri was later added, and she states she feels back to her old self.    She reports good compliance with treatment. She reports good tolerance of treatment. She is not having side effects.   She feels her anxiety is moderate and Improved since last visit.  Symptoms: No chest pain No difficulty concentrating  No dizziness Yes fatigue  No feelings of losing control Yes insomnia  No irritable No palpitations  No panic attacks No racing thoughts  No shortness of breath No sweating  No tremors/shakes    GAD-7 Results GAD-7 Generalized Anxiety Disorder Screening Tool 04/21/2020  1. Feeling Nervous, Anxious, or on Edge 1  2. Not Being Able to Stop or Control Worrying 2  3. Worrying Too Much About Different Things 2  4. Trouble Relaxing 2  5. Being So Restless it's Hard To Sit Still 1  6. Becoming Easily Annoyed or Irritable 1  7. Feeling Afraid As If Something Awful Might Happen 2  Total GAD-7 Score 11  Difficulty At Work, Home, or Getting  Along With Others? Very difficult    PHQ-9 Scores PHQ9 SCORE ONLY 04/21/2020 02/18/2020 05/25/2017  PHQ-9 Total Score 15 17 0    ---------------------------------------------------------------------------------------------------  Lipid/Cholesterol, Follow-up  Last lipid panel Other pertinent labs  Lab Results  Component Value Date   CHOL 227 (H) 02/22/2020   HDL  47 02/22/2020   LDLCALC 154 (H) 02/22/2020   TRIG 142 02/22/2020   CHOLHDL 4.8 (H) 02/22/2020   Lab Results  Component Value Date   ALT 12 02/22/2020   AST 8 02/22/2020   PLT 409 02/22/2020   TSH 4.400 02/22/2020     She was last seen for this 2 months ago.  Management since that visit includes started Rosuvastatin 10mg  daily.  She reports good compliance with treatment. She is not having side effects.   Symptoms: No chest pain No chest  pressure/discomfort  No dyspnea No lower extremity edema  No numbness or tingling of extremity No orthopnea  No palpitations No paroxysmal nocturnal dyspnea  No speech difficulty No syncope   Current diet: in general, an "unhealthy" diet Current exercise: none  The 10-year ASCVD risk score Mikey Bussing DC Jr., et al., 2013) is: 6%  --------------------------------------------------------------------------------------------------- She also states she has occasional migraines, and had one when she was at the psychiatrists office and was given a samples of Cabia (diclofenac oral solutions) which worked very well. She wants to know if she can have a prescription for this.      Medications: Outpatient Medications Prior to Visit  Medication Sig  . amLODipine (NORVASC) 10 MG tablet TAKE 1 TABLET DAILY  . Aspirin-Acetaminophen-Caffeine (EXCEDRIN PO) Take by mouth as needed.  Marland Kitchen buPROPion (WELLBUTRIN XL) 150 MG 24 hr tablet Take 1 tablet (150 mg total) by mouth daily.  . Diclofenac Potassium,Migraine, (CAMBIA) 50 MG PACK Take by mouth.  . DULoxetine (CYMBALTA) 30 MG capsule TAKE 1 CAPSULE DAILY  . hydrochlorothiazide (HYDRODIURIL) 25 MG tablet Take 1 tablet (25 mg total) by mouth daily. TAKE IN PLACE OF INDERIDE  . ketoconazole (NIZORAL) 2 % cream Apply 1 application topically daily.  Marland Kitchen levorphanol (LEVODROMORAN) 2 MG tablet Take 3 mg by mouth every 8 (eight) hours as needed for pain.   Marland Kitchen levothyroxine (SYNTHROID) 150 MCG tablet Take 1 tablet by mouth daily.  Marland Kitchen lurasidone (LATUDA) 20 MG TABS tablet Take 20 mg by mouth at bedtime.  . mupirocin ointment (BACTROBAN) 2 % APPLY 1 APPLICATION INTO THE NOSE TWICE DAILY AS DIRECTED  . omeprazole (PRILOSEC) 40 MG capsule TAKE 1 CAPSULE DAILY  . oxyCODONE (OXY IR/ROXICODONE) 5 MG immediate release tablet Take 5 mg by mouth 2 (two) times daily as needed.  . propranolol ER (INDERAL LA) 60 MG 24 hr capsule Take 1 capsule (60 mg total) by mouth daily. TAKE IN PLACE  OF INDERIDE  . rosuvastatin (CRESTOR) 10 MG tablet Take 1 tablet (10 mg total) by mouth daily.  Marland Kitchen terbinafine (LAMISIL) 250 MG tablet Take 2 tablet daily for 7 days every 4 weeks (1 week a month)  . tiZANidine (ZANAFLEX) 2 MG tablet Take 1 tablet by mouth 2 (two) times daily as needed.  . [DISCONTINUED] metFORMIN (GLUCOPHAGE-XR) 500 MG 24 hr tablet TAKE 1 TABLET BY MOUTH EVERY DAY WITH breakfast  . naproxen (NAPROSYN) 500 MG tablet Take 1 tablet (500 mg total) by mouth 2 (two) times daily with a meal. (Patient not taking: Reported on 04/21/2020)   No facility-administered medications prior to visit.    Review of Systems  Constitutional: Positive for fatigue. Negative for appetite change, chills and fever.  Respiratory: Negative for chest tightness and shortness of breath.   Cardiovascular: Negative for chest pain and palpitations.  Gastrointestinal: Negative for abdominal pain, nausea and vomiting.  Skin: Positive for rash (itchy red rash on both legs).  Neurological: Negative for dizziness and weakness.  Psychiatric/Behavioral: Positive for sleep disturbance. The patient is nervous/anxious.       Objective    BP 116/75 (BP Location: Left Arm, Patient Position: Sitting, Cuff Size: Large)   Pulse 72   Temp 98.3 F (36.8 C) (Oral)   Resp 18   Wt 213 lb (96.6 kg)   LMP  (LMP Unknown)   BMI 38.96 kg/m    Physical Exam  General appearance: Obese female, cooperative and in no acute distress Head: Normocephalic, without obvious abnormality, atraumatic Respiratory: Respirations even and unlabored, normal respiratory rate Extremities: All extremities are intact.  Skin: Skin color, texture, turgor normal. No rashes seen  Psych: Appropriate mood and affect. Neurologic: Mental status: Alert, oriented to person, place, and time, thought content appropriate.   Results for orders placed or performed in visit on 04/21/20  POCT HgB A1C  Result Value Ref Range   Hemoglobin A1C 9.5 (A) 4.0  - 5.6 %   Est. average glucose Bld gHb Est-mCnc 226     Assessment & Plan     1. Type 2 diabetes mellitus without complication, unspecified whether long term insulin use (HCC) Improved since starting metformin, will double to - metFORMIN (GLUCOPHAGE-XR) 500 MG 24 hr tablet; Take 2 tablets (1,000 mg total) by mouth daily.  Dispense: 180 tablet; Refill: 2  2. Need for shingles vaccine Shingrix #2  3. Mixed hyperlipidemia Doing well with initiation of rosuvastatin - Comprehensive metabolic panel - Lipid panel  4. Need for influenza vaccination  - Flu Vaccine QUAD 36+ mos IM (Fluarix/Fluzone)  5. Migraine She did well with sample of Cabia given to her by her psychiatrist. Advised of general alternative. Will try  Diclofenac Potassium,Migraine, - diclofenac (VOLTAREN) 50 MG EC tablet; Take 1 tablet (50 mg total) by mouth 2 (two) times daily as needed (for migrain).  Dispense: 60 tablet; Refill: 1   6. Situational stress Better on current psychiatric medications. Continue current medication. Continue follow up with counselor.   Follow up with annual CPE/pap/pelvic in 3 months.       The entirety of the information documented in the History of Present Illness, Review of Systems and Physical Exam were personally obtained by me. Portions of this information were initially documented by the CMA and reviewed by me for thoroughness and accuracy.      Lelon Huh, MD  [facility] [phone] (phone) [phone] (fax)  [place]

## 2020-04-21 NOTE — Patient Instructions (Signed)
.   Please review the attached list of medications and notify my office if there are any errors.   . Please bring all of your medications to every appointment so we can make sure that our medication list is the same as yours.   

## 2020-04-22 ENCOUNTER — Ambulatory Visit: Payer: Self-pay | Admitting: *Deleted

## 2020-04-22 NOTE — Telephone Encounter (Signed)
Ok to give her a work excuse. Side effects should only last one or two days.

## 2020-04-22 NOTE — Telephone Encounter (Signed)
Summary: advice   Pt had a vaccine yesterday(not covid) and her right arm is swollen, painful and pt has a massive headache / please call Pt to advise      Patient is calling to report that she had 2 vaccines ( flu, shingrix) yesterday and she is having headache, muscle pain (back pain) and brain fog today.Her arm is swollen- but not red or inflamed. Patient states she feels that she can not go to work due to side effects- she does not want to drive.  Care advised given to patient- review of common side effects and treatment recommended given. Patient is requesting letter for work today and she is wanting to know how long this " brain fog" will last. She also wants to know if she should be expecting any other symptoms.Told patient I would send her inquiry to office for PCP review and advice.  Reason for Disposition . Shingles (Herpes zoster; Shingrix) vaccine reactions  Answer Assessment - Initial Assessment Questions 1. SYMPTOMS: "What is the main symptom?" (e.g., redness, swelling, pain)      Brain fog, back pain, headache- migraine, arm swollen 2. ONSET: "When was the vaccine (shot) given?" "How much later did the symptoms begin?" (e.g., hours, days ago)      Yesterday at office appointment, last night- symptoms started patient reports not feeling well and going to bed early 3. SEVERITY: "How bad is it?"      Patient feels she can't go to work- does not want to drive- complains of brain fog- going in room and forgetting why she went in 4. FEVER: "Is there a fever?" If Yes, ask: "What is it, how was it measured, and when did it start?"      No fever 5. IMMUNIZATIONS GIVEN: "What shots have you recently received?"     Flu, shingrix 6. PAST REACTIONS: "Have you reacted to immunizations before?" If Yes, ask: "What happened?"     Local reaction- but not systemic 7. OTHER SYMPTOMS: "Do you have any other symptoms?"     No other symptom- listed above  Protocols used: IMMUNIZATION  REACTIONS-A-AH

## 2020-04-22 NOTE — Telephone Encounter (Signed)
Patient advised and verbalized understanding. Work note placed up front for pick up.

## 2020-07-14 ENCOUNTER — Other Ambulatory Visit: Payer: Self-pay

## 2020-07-14 ENCOUNTER — Encounter: Payer: Self-pay | Admitting: Physician Assistant

## 2020-07-14 ENCOUNTER — Ambulatory Visit: Payer: Managed Care, Other (non HMO) | Admitting: Physician Assistant

## 2020-07-14 VITALS — BP 148/95 | HR 80 | Temp 97.6°F | Wt 213.6 lb

## 2020-07-14 DIAGNOSIS — R21 Rash and other nonspecific skin eruption: Secondary | ICD-10-CM

## 2020-07-14 MED ORDER — DOXYCYCLINE HYCLATE 100 MG PO TABS: 100.0000 mg | ORAL_TABLET | Freq: Two times a day (BID) | ORAL | 0 refills | Status: AC

## 2020-07-14 MED ORDER — FLUCONAZOLE 150 MG PO TABS: 150.0000 mg | ORAL_TABLET | Freq: Every day | ORAL | 0 refills | Status: AC

## 2020-07-14 NOTE — Patient Instructions (Signed)
Rash, Adult  A rash is a change in the color of your skin. A rash can also change the way your skin feels. There are many different conditions and factors that can cause a rash. Follow these instructions at home: The goal of treatment is to stop the itching and keep the rash from spreading. Watch for any changes in your symptoms. Let your doctor know about them. Follow these instructions to help with your condition: Medicine Take or apply over-the-counter and prescription medicines only as told by your doctor. These may include medicines:  To treat red or swollen skin (corticosteroid creams).  To treat itching.  To treat an allergy (oral antihistamines).  To treat very bad symptoms (oral corticosteroids).  Skin care  Put cool cloths (compresses) on the affected areas.  Do not scratch or rub your skin.  Avoid covering the rash. Make sure that the rash is exposed to air as much as possible. Managing itching and discomfort  Avoid hot showers or baths. These can make itching worse. A cold shower may help.  Try taking a bath with: ? Epsom salts. You can get these at your local pharmacy or grocery store. Follow the instructions on the package. ? Baking soda. Pour a small amount into the bath as told by your doctor. ? Colloidal oatmeal. You can get this at your local pharmacy or grocery store. Follow the instructions on the package.  Try putting baking soda paste onto your skin. Stir water into baking soda until it gets like a paste.  Try putting on a lotion that relieves itchiness (calamine lotion).  Keep cool and out of the sun. Sweating and being hot can make itching worse. General instructions   Rest as needed.  Drink enough fluid to keep your pee (urine) pale yellow.  Wear loose-fitting clothing.  Avoid scented soaps, detergents, and perfumes. Use gentle soaps, detergents, perfumes, and other cosmetic products.  Avoid anything that causes your rash. Keep a journal to  help track what causes your rash. Write down: ? What you eat. ? What cosmetic products you use. ? What you drink. ? What you wear. This includes jewelry.  Keep all follow-up visits as told by your doctor. This is important. Contact a doctor if:  You sweat at night.  You lose weight.  You pee (urinate) more than normal.  You pee less than normal, or you notice that your pee is a darker color than normal.  You feel weak.  You throw up (vomit).  Your skin or the whites of your eyes look yellow (jaundice).  Your skin: ? Tingles. ? Is numb.  Your rash: ? Does not go away after a few days. ? Gets worse.  You are: ? More thirsty than normal. ? More tired than normal.  You have: ? New symptoms. ? Pain in your belly (abdomen). ? A fever. ? Watery poop (diarrhea). Get help right away if:  You have a fever and your symptoms suddenly get worse.  You start to feel mixed up (confused).  You have a very bad headache or a stiff neck.  You have very bad joint pains or stiffness.  You have jerky movements that you cannot control (seizure).  Your rash covers all or most of your body. The rash may or may not be painful.  You have blisters that: ? Are on top of the rash. ? Grow larger. ? Grow together. ? Are painful. ? Are inside your nose or mouth.  You have a rash   that: ? Looks like purple pinprick-sized spots all over your body. ? Has a "bull's eye" or looks like a target. ? Is red and painful, causes your skin to peel, and is not from being in the sun too long. Summary  A rash is a change in the color of your skin. A rash can also change the way your skin feels.  The goal of treatment is to stop the itching and keep the rash from spreading.  Take or apply over-the-counter and prescription medicines only as told by your doctor.  Contact a doctor if you have new symptoms or symptoms that get worse.  Keep all follow-up visits as told by your doctor. This is  important. This information is not intended to replace advice given to you by your health care provider. Make sure you discuss any questions you have with your health care provider. Document Revised: 12/01/2018 Document Reviewed: 03/13/2018 Elsevier Patient Education  2020 [employer] Inc[employer]  

## 2020-07-14 NOTE — Progress Notes (Signed)
Established patient visit   Patient: Jean Cox   DOB: Feb 15, 1961   59 y.o. Female  MRN: [id number] Visit Date: 07/14/2020  Today's healthcare provider: Trinna Post, PA-C   Chief Complaint  Patient presents with  . Rash  I,Porsha C McClurkin,acting as a scribe for Trinna Post, PA-C.,have documented all relevant documentation on the behalf of Trinna Post, PA-C,as directed by  Trinna Post, PA-C while in the presence of Trinna Post, PA-C.  Subjective    Rash This is a recurrent problem. The current episode started 1 to 4 weeks ago. The problem has been gradually worsening since onset. The affected locations include the abdomen. The rash is characterized by itchiness, redness, blistering, dryness, draining and pain. She was exposed to nothing. Pertinent negatives include no cough, diarrhea or shortness of breath. Past treatments include moisturizer, antihistamine, anti-itch cream and antibiotic cream. The treatment provided mild relief.    Patient reports a recurring rash, this is the third occurrence. She reports it initially started on her legs. Then it appeared on her abdomen. She reports itchiness, pain, and multiple red lesions. The rash occasionally weeps. She reports being treated with valtrex for possible shingles and ketaconazole for possible fungal infection. These treatments did not help much.      Medications: Outpatient Medications Prior to Visit  Medication Sig  . amLODipine (NORVASC) 10 MG tablet TAKE 1 TABLET DAILY  . Aspirin-Acetaminophen-Caffeine (EXCEDRIN PO) Take by mouth as needed.  Marland Kitchen buPROPion (WELLBUTRIN XL) 150 MG 24 hr tablet Take 1 tablet (150 mg total) by mouth daily.  . diclofenac (VOLTAREN) 50 MG EC tablet Take 1 tablet (50 mg total) by mouth 2 (two) times daily as needed (for migrain).  . Diclofenac Potassium,Migraine, (CAMBIA) 50 MG PACK Take by mouth.  . DULoxetine (CYMBALTA) 30 MG capsule TAKE 1 CAPSULE DAILY  .  hydrochlorothiazide (HYDRODIURIL) 25 MG tablet Take 1 tablet (25 mg total) by mouth daily. TAKE IN PLACE OF INDERIDE  . ketoconazole (NIZORAL) 2 % cream Apply 1 application topically daily.  Marland Kitchen levorphanol (LEVODROMORAN) 2 MG tablet Take 3 mg by mouth every 8 (eight) hours as needed for pain.   Marland Kitchen levothyroxine (SYNTHROID) 150 MCG tablet Take 1 tablet by mouth daily.  Marland Kitchen lurasidone (LATUDA) 20 MG TABS tablet Take 20 mg by mouth at bedtime.  . metFORMIN (GLUCOPHAGE-XR) 500 MG 24 hr tablet Take 2 tablets (1,000 mg total) by mouth daily.  . mupirocin ointment (BACTROBAN) 2 % APPLY 1 APPLICATION INTO THE NOSE TWICE DAILY AS DIRECTED  . omeprazole (PRILOSEC) 40 MG capsule TAKE 1 CAPSULE DAILY  . oxyCODONE (OXY IR/ROXICODONE) 5 MG immediate release tablet Take 5 mg by mouth 2 (two) times daily as needed.  . propranolol ER (INDERAL LA) 60 MG 24 hr capsule Take 1 capsule (60 mg total) by mouth daily. TAKE IN PLACE OF INDERIDE  . rosuvastatin (CRESTOR) 10 MG tablet Take 1 tablet (10 mg total) by mouth daily.  Marland Kitchen tiZANidine (ZANAFLEX) 2 MG tablet Take 1 tablet by mouth 2 (two) times daily as needed.  . [DISCONTINUED] terbinafine (LAMISIL) 250 MG tablet Take 2 tablet daily for 7 days every 4 weeks (1 week a month)  . naproxen (NAPROSYN) 500 MG tablet Take 1 tablet (500 mg total) by mouth 2 (two) times daily with a meal. (Patient not taking: Reported on 04/21/2020)   No facility-administered medications prior to visit.    Review of Systems  Constitutional: Negative.  Respiratory: Negative for cough and shortness of breath.   Gastrointestinal: Negative for diarrhea.  Skin: Positive for rash.  Hematological: Negative.       Objective    BP (!) 148/95 (BP Location: Left Arm, Patient Position: Sitting, Cuff Size: Large)   Pulse 80   Temp 97.6 F (36.4 C) (Oral)   Wt 213 lb 9.6 oz (96.9 kg)   LMP  (LMP Unknown)   SpO2 95%   BMI 39.07 kg/m    Physical Exam Constitutional:      Appearance: Normal  appearance. She is obese.  Skin:    General: Skin is warm and dry.     Findings: Rash present.  Neurological:     General: No focal deficit present.     Mental Status: She is alert and oriented to person, place, and time.  Psychiatric:        Mood and Affect: Mood normal.        Behavior: Behavior normal.     Media Information   Document Information  Photos  Abdomen rash   07/14/2020 15:51  Attached To:  Office Visit on 07/14/20 with Trinna Post, PA-C  Source Information  Paulene Floor  [place]  Photos  Abdomen rash #2  07/14/2020 15:51  Attached To:  Office Visit on 07/14/20 with Trinna Post, PA-C  Source Information  Carles Collet M, PA-C  [facility]      No results found for any visits on 07/14/20.  Assessment & Plan    1. Skin rash  I am unsure the etiology however there does seem to be superimposed bacterial infection. Will treat as below for bacterial and yeast rashes. Will refer to derm for further treatment .  - doxycycline (VIBRA-TABS) 100 MG tablet; Take 1 tablet (100 mg total) by mouth 2 (two) times daily for 7 days.  Dispense: 14 tablet; Refill: 0 - fluconazole (DIFLUCAN) 150 MG tablet; Take 1 tablet (150 mg total) by mouth daily.  Dispense: 7 tablet; Refill: 0 - Ambulatory referral to Dermatology   Return if symptoms worsen or fail to improve.      ITrinna Post, PA-C, have reviewed all documentation for this visit. The documentation on 07/15/20 for the exam, diagnosis, procedures, and orders are all accurate and complete.  The entirety of the information documented in the History of Present Illness, Review of Systems and Physical Exam were personally obtained by me. Portions of this information were initially documented by [facility] and reviewed by me for thoroughness and accuracy.     Paulene Floor  [facility] [phone] (phone) [phone] (fax)  [place]

## 2020-07-22 ENCOUNTER — Other Ambulatory Visit: Payer: Self-pay

## 2020-07-22 ENCOUNTER — Encounter: Payer: Self-pay | Admitting: Family Medicine

## 2020-07-22 ENCOUNTER — Ambulatory Visit (INDEPENDENT_AMBULATORY_CARE_PROVIDER_SITE_OTHER): Payer: Managed Care, Other (non HMO) | Admitting: Family Medicine

## 2020-07-22 ENCOUNTER — Other Ambulatory Visit (HOSPITAL_COMMUNITY)
Admission: RE | Admit: 2020-07-22 | Discharge: 2020-07-22 | Disposition: A | Discharge status: Home / Self Care | Payer: Managed Care, Other (non HMO) | Source: Ambulatory Visit | Attending: Family Medicine | Admitting: Family Medicine

## 2020-07-22 VITALS — BP 151/93 | HR 80 | Temp 98.0°F | Resp 16 | Ht 62.0 in | Wt 215.0 lb

## 2020-07-22 DIAGNOSIS — F39 Unspecified mood [affective] disorder: Secondary | ICD-10-CM

## 2020-07-22 DIAGNOSIS — Z1231 Encounter for screening mammogram for malignant neoplasm of breast: Secondary | ICD-10-CM | POA: Diagnosis not present

## 2020-07-22 DIAGNOSIS — E119 Type 2 diabetes mellitus without complications: Secondary | ICD-10-CM

## 2020-07-22 DIAGNOSIS — E039 Hypothyroidism, unspecified: Secondary | ICD-10-CM | POA: Diagnosis not present

## 2020-07-22 DIAGNOSIS — Z124 Encounter for screening for malignant neoplasm of cervix: Secondary | ICD-10-CM | POA: Diagnosis not present

## 2020-07-22 DIAGNOSIS — E785 Hyperlipidemia, unspecified: Secondary | ICD-10-CM

## 2020-07-22 DIAGNOSIS — R21 Rash and other nonspecific skin eruption: Secondary | ICD-10-CM | POA: Diagnosis not present

## 2020-07-22 DIAGNOSIS — I10 Essential (primary) hypertension: Secondary | ICD-10-CM

## 2020-07-22 DIAGNOSIS — Z0000 Encounter for general adult medical examination without abnormal findings: Secondary | ICD-10-CM | POA: Diagnosis not present

## 2020-07-22 NOTE — Progress Notes (Signed)
Complete physical exam   Patient: Jean Cox   DOB: [birth year]   59 y.o. Female  MRN: [id number] Visit Date: 07/22/2020  Today's healthcare provider: Lelon Huh, MD   Chief Complaint  Patient presents with  . Annual Exam  . Diabetes  . Hyperlipidemia   Subjective    Jean Cox is a 59 y.o. female who presents today for a complete physical exam.  She reports consuming a general diet. The patient does not participate in regular exercise at present. She generally feels fairly well. She reports sleeping poorly. She does have additional problems to discuss today.  HPI  Diabetes Mellitus Type II, Follow-up  Lab Results  Component Value Date   HGBA1C 9.5 (A) 04/21/2020   HGBA1C 10.2 (A) 02/18/2020   HGBA1C 12.6 (H) 04/20/2017   Wt Readings from Last 3 Encounters:  07/22/20 215 lb (97.5 kg)  07/14/20 213 lb 9.6 oz (96.9 kg)  04/21/20 213 lb (96.6 kg)   Last seen for diabetes 3 months ago.  Management since then includes increasing Metformin from 500mg  daily to 1000mg  daily. She reports good compliance with treatment. She is not having side effects.  Symptoms: No fatigue No foot ulcerations  No appetite changes No nausea  No paresthesia of the feet  No polydipsia  No polyuria No visual disturbances   No vomiting     Home blood sugar records: blood sugars are not checked  Episodes of hypoglycemia? No    Current insulin regiment: none Most Recent Eye Exam: not UTD Current exercise: none Current diet habits: in general, an "unhealthy" diet  Pertinent Labs: Lab Results  Component Value Date   CHOL 227 (H) 02/22/2020   HDL 47 02/22/2020   LDLCALC 154 (H) 02/22/2020   TRIG 142 02/22/2020   CHOLHDL 4.8 (H) 02/22/2020   Lab Results  Component Value Date   NA 138 02/22/2020   K 3.7 02/22/2020   CREATININE 0.63 02/22/2020   GFRNONAA 101 02/22/2020   GFRAA 116 02/22/2020   GLUCOSE 237 (H) 02/22/2020      ---------------------------------------------------------------------------------------------------  Follow up for Migraines:  The patient was last seen for this 3 months ago. Changes made at last visit include trying diclofenac (VOLTAREN) 50 MG EC tablet; Take 1 tablet (50 mg total) by mouth 2 (two) times daily as needed (for migraine) .  She reports good compliance with treatment. She feels that condition is Improved. She is not having side effects.   -----------------------------------------------------------------------------------------  Lipid/Cholesterol, Follow-up  Last lipid panel Other pertinent labs  Lab Results  Component Value Date   CHOL 227 (H) 02/22/2020   HDL 47 02/22/2020   LDLCALC 154 (H) 02/22/2020   TRIG 142 02/22/2020   CHOLHDL 4.8 (H) 02/22/2020   Lab Results  Component Value Date   ALT 12 02/22/2020   AST 8 02/22/2020   PLT 409 02/22/2020   TSH 4.400 02/22/2020     She was last seen for this 3 months ago.  Management since that visit includes ordering labs (blood work was not completed).  She reports good compliance with treatment. She is not having side effects.   Symptoms: No chest pain No chest pressure/discomfort  No dyspnea No lower extremity edema  No numbness or tingling of extremity No orthopnea  No palpitations No paroxysmal nocturnal dyspnea  No speech difficulty No syncope   Current diet: in general, an "unhealthy" diet Current exercise: none  The 10-year ASCVD risk score Mikey Bussing DC Brooke Bonito., et al.,  2013) is: 10.9%  --------------------------------------------------------------------------------------------------- She has also had recurrent rash popping up in different areaS for several months. Most recently seEd by Marton Redwood for rash across her fold of panus on 11/22 and treated with doxycycline for cellulitis and fluconazole for suspected year. Dermatology referral was placed but not yet scheduled. She states rash has improved,  but is now spreading to other areas around her sides and lower back. It is very itchy.   Past Medical History:  Diagnosis Date  . Diabetes mellitus without complication ([place])   . Hx of migraine headaches   . Kidney stones   . Varicose vein of leg    Past Surgical History:  Procedure Laterality Date  . APPENDECTOMY  1976  . COLONOSCOPY WITH PROPOFOL N/A 04/17/2020   Procedure: COLONOSCOPY WITH PROPOFOL;  Surgeon: Jonathon Bellows, MD;  Location: [facility] ENDOSCOPY;  Service: Gastroenterology;  Laterality: N/A;  . Laser ablation right saphenous vein Right 02/22/2014   Dr. Delana Meyer  . TONSILLECTOMY AND ADENOIDECTOMY  1972   Social History   Socioeconomic History  . Marital status: Widowed    Spouse name: Not on file  . Number of children: Not on file  . Years of education: Not on file  . Highest education level: Not on file  Occupational History  . Not on file  Tobacco Use  . Smoking status: Former Smoker    Packs/day: 1.00    Years: 25.00    Pack years: 25.00    Types: Cigarettes    Quit date: 08/24/2003    Years since quitting: 16.9  . Smokeless tobacco: Never Used  Substance and Sexual Activity  . Alcohol use: No    Alcohol/week: 0.0 standard drinks  . Drug use: No  . Sexual activity: Not on file  Other Topics Concern  . Not on file  Social History Narrative  . Not on file   Social Determinants of Health   Financial Resource Strain:   . Difficulty of Paying Living Expenses: Not on file  Food Insecurity:   . Worried About Charity fundraiser in the Last Year: Not on file  . Ran Out of Food in the Last Year: Not on file  Transportation Needs:   . Lack of Transportation (Medical): Not on file  . Lack of Transportation (Non-Medical): Not on file  Physical Activity:   . Days of Exercise per Week: Not on file  . Minutes of Exercise per Session: Not on file  Stress:   . Feeling of Stress : Not on file  Social Connections:   . Frequency of Communication with Friends and  Family: Not on file  . Frequency of Social Gatherings with Friends and Family: Not on file  . Attends Religious Services: Not on file  . Active Member of Clubs or Organizations: Not on file  . Attends Archivist Meetings: Not on file  . Marital Status: Not on file  Intimate Partner Violence:   . Fear of Current or Ex-Partner: Not on file  . Emotionally Abused: Not on file  . Physically Abused: Not on file  . Sexually Abused: Not on file   Family Status  Relation Name Status  . Mother  Deceased       Ovarian Cancer  . Father  Alive       Living with Germ Cell Cancer, CABG  . Sister  Alive  . Sister  (Not Specified)   Family History  Problem Relation Age of Onset  . Heart attack Mother   .  Cancer Mother   . CAD Father   . Hypertension Father   . Hypertension Sister   . Hypertension Sister    Allergies  Allergen Reactions  . Amitriptyline Hcl     Sedation  . Lexapro  [Escitalopram]     skittish and suidical  . Lyrica  [Pregabalin]     Nightmares  . Topamax  [Topiramate]     Other reaction(s): Headache  . Erythromycin Rash  . Lisinopril Cough    Patient Care Team: Birdie Sons, MD as PCP - General (Family Medicine) Gabriel Carina Betsey Holiday, MD as Physician Assistant (Endocrinology)   Medications: Outpatient Medications Prior to Visit  Medication Sig  . amLODipine (NORVASC) 10 MG tablet TAKE 1 TABLET DAILY  . Aspirin-Acetaminophen-Caffeine (EXCEDRIN PO) Take by mouth as needed.  Marland Kitchen buPROPion (WELLBUTRIN XL) 150 MG 24 hr tablet Take 1 tablet (150 mg total) by mouth daily.  . diclofenac (VOLTAREN) 50 MG EC tablet Take 1 tablet (50 mg total) by mouth 2 (two) times daily as needed (for migrain).  . DULoxetine (CYMBALTA) 30 MG capsule TAKE 1 CAPSULE DAILY  . fluconazole (DIFLUCAN) 150 MG tablet Take 1 tablet (150 mg total) by mouth daily.  . hydrochlorothiazide (HYDRODIURIL) 25 MG tablet Take 1 tablet (25 mg total) by mouth daily. TAKE IN PLACE OF INDERIDE  .  ketoconazole (NIZORAL) 2 % cream Apply 1 application topically daily.  Marland Kitchen levothyroxine (SYNTHROID) 150 MCG tablet Take 1 tablet by mouth daily.  Marland Kitchen lurasidone (LATUDA) 20 MG TABS tablet Take 20 mg by mouth at bedtime.  . metFORMIN (GLUCOPHAGE-XR) 500 MG 24 hr tablet Take 2 tablets (1,000 mg total) by mouth daily.  . mupirocin ointment (BACTROBAN) 2 % APPLY 1 APPLICATION INTO THE NOSE TWICE DAILY AS DIRECTED  . naproxen (NAPROSYN) 500 MG tablet Take 1 tablet (500 mg total) by mouth 2 (two) times daily with a meal.  . omeprazole (PRILOSEC) 40 MG capsule TAKE 1 CAPSULE DAILY  . oxyCODONE (OXY IR/ROXICODONE) 5 MG immediate release tablet Take 5 mg by mouth 2 (two) times daily as needed.  . OXYCONTIN 30 MG 12 hr tablet Take 1 tablet by mouth 2 (two) times daily.  . propranolol ER (INDERAL LA) 60 MG 24 hr capsule Take 1 capsule (60 mg total) by mouth daily. TAKE IN PLACE OF INDERIDE  . rosuvastatin (CRESTOR) 10 MG tablet Take 1 tablet (10 mg total) by mouth daily.  Marland Kitchen tiZANidine (ZANAFLEX) 2 MG tablet Take 1 tablet by mouth 2 (two) times daily as needed.  . [DISCONTINUED] Diclofenac Potassium,Migraine, (CAMBIA) 50 MG PACK Take by mouth. (Patient not taking: Reported on 07/22/2020)  . [DISCONTINUED] levorphanol (LEVODROMORAN) 2 MG tablet Take 3 mg by mouth every 8 (eight) hours as needed for pain.  (Patient not taking: Reported on 07/22/2020)   No facility-administered medications prior to visit.    Review of Systems  Constitutional: Positive for fatigue. Negative for chills and fever.  HENT: Positive for rhinorrhea. Negative for congestion, ear pain, sneezing and sore throat.   Eyes: Negative.  Negative for pain and redness.  Respiratory: Negative for cough, shortness of breath and wheezing.   Cardiovascular: Negative for chest pain and leg swelling.  Gastrointestinal: Positive for nausea. Negative for abdominal pain, blood in stool, constipation and diarrhea.  Endocrine: Positive for heat  intolerance. Negative for polydipsia and polyphagia.  Genitourinary: Negative.  Negative for dysuria, flank pain, hematuria, pelvic pain, vaginal bleeding and vaginal discharge.  Musculoskeletal: Positive for arthralgias and back pain. Negative  for gait problem and joint swelling.  Skin: Positive for rash.  Neurological: Positive for headaches. Negative for dizziness, tremors, seizures, weakness, light-headedness and numbness.  Hematological: Negative for adenopathy.  Psychiatric/Behavioral: Positive for decreased concentration, sleep disturbance and suicidal ideas. Negative for behavioral problems, confusion and dysphoric mood. The patient is not nervous/anxious and is not hyperactive.       Objective    BP (!) 151/93 (BP Location: Left Arm, Patient Position: Sitting, Cuff Size: Large)   Pulse 80   Temp 98 F (36.7 C) (Oral)   Resp 16   Ht 5\' 2"  (1.575 m)   Wt 215 lb (97.5 kg)   LMP  (LMP Unknown)   BMI 39.32 kg/m    Physical Exam   General Appearance:    Obese female. Alert, cooperative, in no acute distress, appears stated age   Head:    Normocephalic, without obvious abnormality, atraumatic  Eyes:    PERRL, conjunctiva/corneas clear, EOM's intact, fundi    benign, both eyes  Ears:    Normal TM's and external ear canals, both ears  Neck:   Supple, symmetrical, trachea midline, no adenopathy;    thyroid:  no enlargement/tenderness/nodules; no carotid   bruit or JVD  Back:     Symmetric, no curvature, ROM normal, no CVA tenderness  Lungs:     Clear to auscultation bilaterally, respirations unlabored  Chest Wall:    No tenderness or deformity   Heart:    Normal heart rate. Normal rhythm. No murmurs, rubs, or gallops.   Breast Exam:    normal appearance, no masses or tenderness  Abdomen:     Soft, non-tender, bowel sounds active all four quadrants,    no masses, no organomegaly  Pelvic:    cervix normal in appearance, external genitalia normal, no cervical motion tenderness  and vagina normal without discharge  Extremities:   All extremities are intact. No cyanosis or edema  Pulses:   2+ and symmetric all extremities  Skin:   Scattered lesions across lower abdomen sides and back several with erythematous borders and central clearing c/w tinea, others with shallow erosions. No generally erythema of skin.   Lymph nodes:   Cervical, supraclavicular, and axillary nodes normal  Neurologic:   CNII-XII intact, normal strength, sensation and reflexes    throughout    Last depression screening scores PHQ 2/9 Scores 07/22/2020 07/14/2020 04/21/2020  PHQ - 2 Score 6 4 4   PHQ- 9 Score 24 18 15    Last fall risk screening Fall Risk  07/22/2020  Falls in the past year? 0  Number falls in past yr: 0  Injury with Fall? 0  Risk for fall due to : -  Follow up Falls evaluation completed   Last Audit-C alcohol use screening Alcohol Use Disorder Test (AUDIT) 07/22/2020  1. How often do you have a drink containing alcohol? 0  2. How many drinks containing alcohol do you have on a typical day when you are drinking? 0  3. How often do you have six or more drinks on one occasion? 0  AUDIT-C Score 0  Alcohol Brief Interventions/Follow-up AUDIT Score <7 follow-up not indicated   A score of 3 or more in women, and 4 or more in men indicates increased risk for alcohol abuse, EXCEPT if all of the points are from question 1   No results found for any visits on 07/22/20.  Assessment & Plan    Routine Health Maintenance and Physical Exam  Exercise Activities and Dietary  recommendations Goals   None     Immunization History  Administered Date(s) Administered  . Influenza, Quadrivalent, Recombinant, Inj, Pf 11/09/2019  . Influenza,inj,Quad PF,6+ Mos 06/17/2015, 06/07/2016, 04/19/2017, 04/21/2020  . Janssen (J&J) SARS-COV-2 Vaccination 11/21/2019  . Pneumococcal Polysaccharide-23 02/18/2020  . Tdap 02/18/2020  . Zoster Recombinat (Shingrix) 02/18/2020, 04/21/2020     Health Maintenance  Topic Date Due  . FOOT EXAM  Never done  . OPHTHALMOLOGY EXAM  Never done  . PAP SMEAR-Modifier  02/23/2008  . MAMMOGRAM  06/19/2013  . HEMOGLOBIN A1C  10/20/2020  . URINE MICROALBUMIN  02/17/2021  . TETANUS/TDAP  02/17/2030  . COLONOSCOPY  04/17/2030  . INFLUENZA VACCINE  Completed  . PNEUMOCOCCAL POLYSACCHARIDE VACCINE AGE 76-64 HIGH RISK  Completed  . COVID-19 Vaccine  Completed  . Hepatitis C Screening  Completed  . HIV Screening  Completed    Discussed health benefits of physical activity, and encouraged her to engage in regular exercise appropriate for her age and condition.  1. Annual physical exam   2. Screening for cervical cancer  - Cytology - PAP with HPV  3. Rash Improved with doxycycline and diflucan, although now seems to be spreading. check- Aerobic culture  Consider adding topical steroid and antifungal. Derm referral from last visit never completed.  - Aerobic culture  4. Breast cancer screening by mammogram  - MM Digital Screening Bilateral; Future  5. Primary hypertension Usually better controlled, see how labs look before adjusting medications.   6. Hypothyroidism, unspecified type  - TSH  7. Hyperlipidemia, unspecified hyperlipidemia type She is tolerating rosuvastatin well with no adverse effects.    - CBC - Comprehensive metabolic panel - Lipid panel  8. Type 2 diabetes mellitus without complication, unspecified whether long term insulin use (HCC)  - Hemoglobin A1c   9. Major depression Stable on current medication managed by psychiatry      The entirety of the information documented in the History of Present Illness, Review of Systems and Physical Exam were personally obtained by me. Portions of this information were initially documented by the CMA and reviewed by me for thoroughness and accuracy.      Lelon Huh, MD  [facility] [phone] (phone) [phone] (fax)  [place]

## 2020-07-23 ENCOUNTER — Other Ambulatory Visit: Payer: Self-pay | Admitting: Family Medicine

## 2020-07-23 DIAGNOSIS — E785 Hyperlipidemia, unspecified: Secondary | ICD-10-CM

## 2020-07-23 DIAGNOSIS — E119 Type 2 diabetes mellitus without complications: Secondary | ICD-10-CM

## 2020-07-23 DIAGNOSIS — I10 Essential (primary) hypertension: Secondary | ICD-10-CM

## 2020-07-23 LAB — CBC
Hematocrit: 42.1 % (ref 34.0–46.6)
Hemoglobin: 13.9 g/dL (ref 11.1–15.9)
MCH: 26.7 pg (ref 26.6–33.0)
MCHC: 33 g/dL (ref 31.5–35.7)
MCV: 81 fL (ref 79–97)
Platelets: 503 10*3/uL — ABNORMAL HIGH (ref 150–450)
RBC: 5.21 x10E6/uL (ref 3.77–5.28)
RDW: 13.4 % (ref 11.7–15.4)
WBC: 17.5 10*3/uL — ABNORMAL HIGH (ref 3.4–10.8)

## 2020-07-23 LAB — COMPREHENSIVE METABOLIC PANEL
ALT: 11 IU/L (ref 0–32)
AST: 9 IU/L (ref 0–40)
Albumin/Globulin Ratio: 1.5 (ref 1.2–2.2)
Albumin: 4.4 g/dL (ref 3.8–4.9)
Alkaline Phosphatase: 120 IU/L (ref 44–121)
BUN/Creatinine Ratio: 18 (ref 9–23)
BUN: 13 mg/dL (ref 6–24)
Bilirubin Total: 0.4 mg/dL (ref 0.0–1.2)
CO2: 23 mmol/L (ref 20–29)
Calcium: 9.9 mg/dL (ref 8.7–10.2)
Chloride: 99 mmol/L (ref 96–106)
Creatinine, Ser: 0.71 mg/dL (ref 0.57–1.00)
GFR calc Af Amer: 109 mL/min/{1.73_m2} (ref >59)
GFR calc non Af Amer: 95 mL/min/{1.73_m2} (ref >59)
Globulin, Total: 3 g/dL (ref 1.5–4.5)
Glucose: 211 mg/dL — ABNORMAL HIGH (ref 65–99)
Potassium: 4.5 mmol/L (ref 3.5–5.2)
Sodium: 136 mmol/L (ref 134–144)
Total Protein: 7.4 g/dL (ref 6.0–8.5)

## 2020-07-23 LAB — LIPID PANEL
Chol/HDL Ratio: 3.3 ratio (ref 0.0–4.4)
Cholesterol, Total: 184 mg/dL (ref 100–199)
HDL: 55 mg/dL (ref >39)
LDL Chol Calc (NIH): 105 mg/dL — ABNORMAL HIGH (ref 0–99)
Triglycerides: 137 mg/dL (ref 0–149)
VLDL Cholesterol Cal: 24 mg/dL (ref 5–40)

## 2020-07-23 LAB — HEMOGLOBIN A1C
Est. average glucose Bld gHb Est-mCnc: 258 mg/dL
Hgb A1c MFr Bld: 10.6 % — ABNORMAL HIGH (ref 4.8–5.6)

## 2020-07-23 LAB — TSH: TSH: 2.93 u[IU]/mL (ref 0.450–4.500)

## 2020-07-23 MED ORDER — ROSUVASTATIN CALCIUM 10 MG PO TABS: 10.0000 mg | ORAL_TABLET | Freq: Every day | ORAL | 3 refills | Status: AC

## 2020-07-23 MED ORDER — VALSARTAN 80 MG PO TABS: 80.0000 mg | ORAL_TABLET | Freq: Every day | ORAL | 1 refills | Status: DC

## 2020-07-24 LAB — CYTOLOGY - PAP
Adequacy: ABSENT
Comment: NEGATIVE
Diagnosis: NEGATIVE
High risk HPV: NEGATIVE

## 2020-07-27 LAB — AEROBIC CULTURE

## 2020-07-30 MED ORDER — METFORMIN HCL ER 500 MG PO TB24: 1000.0000 mg | ORAL_TABLET | Freq: Two times a day (BID) | ORAL | Status: DC

## 2020-08-15 ENCOUNTER — Other Ambulatory Visit: Payer: Self-pay | Admitting: Family Medicine

## 2020-08-25 ENCOUNTER — Other Ambulatory Visit: Payer: Self-pay | Admitting: Family Medicine

## 2020-09-16 ENCOUNTER — Ambulatory Visit: Payer: Managed Care, Other (non HMO) | Admitting: Family Medicine

## 2020-09-16 ENCOUNTER — Encounter: Payer: Self-pay | Admitting: Family Medicine

## 2020-09-16 ENCOUNTER — Other Ambulatory Visit: Payer: Self-pay

## 2020-09-16 ENCOUNTER — Ambulatory Visit: Payer: Managed Care, Other (non HMO) | Admitting: Dermatology

## 2020-09-16 VITALS — BP 149/89 | HR 99 | Temp 98.6°F | Resp 16 | Ht 62.0 in | Wt 217.0 lb

## 2020-09-16 DIAGNOSIS — Z23 Encounter for immunization: Secondary | ICD-10-CM | POA: Diagnosis not present

## 2020-09-16 DIAGNOSIS — I10 Essential (primary) hypertension: Secondary | ICD-10-CM | POA: Diagnosis not present

## 2020-09-16 DIAGNOSIS — E119 Type 2 diabetes mellitus without complications: Secondary | ICD-10-CM

## 2020-09-16 DIAGNOSIS — R21 Rash and other nonspecific skin eruption: Secondary | ICD-10-CM | POA: Diagnosis not present

## 2020-09-16 LAB — POCT GLYCOSYLATED HEMOGLOBIN (HGB A1C)
Est. average glucose Bld gHb Est-mCnc: 197
Hemoglobin A1C: 8.5 % — AB (ref 4.0–5.6)

## 2020-09-16 MED ORDER — METFORMIN HCL ER 500 MG PO TB24: 1000.0000 mg | ORAL_TABLET | Freq: Two times a day (BID) | ORAL | 3 refills | Status: DC

## 2020-09-16 MED ORDER — ACCU-CHEK GUIDE VI STRP: ORAL_STRIP | 4 refills | Status: AC

## 2020-09-16 MED ORDER — VALSARTAN 80 MG PO TABS: 80.0000 mg | ORAL_TABLET | Freq: Every day | ORAL | 4 refills | Status: AC

## 2020-09-16 NOTE — Progress Notes (Signed)
° °  New Patient Visit  Subjective  Jean Cox is a 60 y.o. female who presents for the following: New Patient (Initial Visit) (Patient here today as a new patient. She denies both personal and family history of skin cancer. Patient states the rash on legs started when she got first shingle vaccine 6 months ago.  It went away and came back on lower abdomen after she got 2nd shingles vaccine. Pcp give ketoconazole and  culture done and could not find anything. It then came back a third time on abdomen unrelated to vaccine. It took 2 months to get an appointment- Patient's rash has since resolved but she is left with some scabs. ) She is no longer itching.    Objective  Well appearing patient in no apparent distress; mood and affect are within normal limits.  A focused examination was performed including abdomen. Relevant physical exam findings are noted in the Assessment and Plan.  Objective  Lower Abdomen: Hyperpigmented macules and patches with healing excoriations on lower abdomen Pt photos viewed which showed bright red edematous papules and plaques of lower abdomen  Assessment & Plan  Rash and other nonspecific skin eruption Lower Abdomen  Clear today. Possible dermatitis (unclear etiology)   Recommend that she call the office to be worked in when rash is active so we can re-evaluate, and possibly biopsy  Recommend moisturizing skin on abdomen and avoid scratching healing spots  Return if symptoms worsen or fail to improve, for follow up as needed .  I, Ruthell Rummage, CMA, am acting as scribe for Brendolyn Patty, MD.  Documentation: I have reviewed the above documentation for accuracy and completeness, and I agree with the above.  Brendolyn Patty MD

## 2020-09-16 NOTE — Patient Instructions (Signed)
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
## 2020-09-17 MED ORDER — METFORMIN HCL ER 500 MG PO TB24
ORAL_TABLET | ORAL | 3 refills | Status: AC
Start: 2020-09-17 — End: ?

## 2020-09-17 NOTE — Addendum Note (Signed)
Addended by: Birdie Sons on: 09/17/2020 01:34 PM   Modules accepted: Orders

## 2020-12-22 ENCOUNTER — Ambulatory Visit: Payer: Self-pay | Admitting: Family Medicine

## 2021-02-11 ENCOUNTER — Other Ambulatory Visit: Payer: Self-pay | Admitting: Family Medicine

## 2021-02-23 ENCOUNTER — Other Ambulatory Visit: Payer: Self-pay | Admitting: Family Medicine

## 2021-02-23 DIAGNOSIS — K219 Gastro-esophageal reflux disease without esophagitis: Secondary | ICD-10-CM
